# Patient Record
Sex: Female | Born: 1990 | Race: White | Hispanic: No | Marital: Single | Smoking: Former smoker
Health system: Southern US, Community
[De-identification: ages and names within clinical notes are randomized; demographics above are authoritative.]

## PROBLEM LIST (undated history)

## (undated) DIAGNOSIS — R569 Unspecified convulsions: Secondary | ICD-10-CM

## (undated) DIAGNOSIS — R51 Headache: Secondary | ICD-10-CM

## (undated) DIAGNOSIS — R519 Headache, unspecified: Secondary | ICD-10-CM

## (undated) DIAGNOSIS — F419 Anxiety disorder, unspecified: Secondary | ICD-10-CM

## (undated) HISTORY — PX: WISDOM TOOTH EXTRACTION: SHX21

---

## 2017-05-05 ENCOUNTER — Observation Stay (HOSPITAL_BASED_OUTPATIENT_CLINIC_OR_DEPARTMENT_OTHER)
Admission: EM | Admit: 2017-05-05 | Discharge: 2017-05-06 | Disposition: A | Discharge status: Home / Self Care | Payer: BLUE CROSS/BLUE SHIELD | Attending: Internal Medicine | Admitting: Internal Medicine

## 2017-05-05 ENCOUNTER — Encounter (HOSPITAL_BASED_OUTPATIENT_CLINIC_OR_DEPARTMENT_OTHER): Payer: Self-pay | Admitting: *Deleted

## 2017-05-05 ENCOUNTER — Emergency Department (HOSPITAL_BASED_OUTPATIENT_CLINIC_OR_DEPARTMENT_OTHER): Payer: BLUE CROSS/BLUE SHIELD

## 2017-05-05 DIAGNOSIS — G9389 Other specified disorders of brain: Secondary | ICD-10-CM

## 2017-05-05 DIAGNOSIS — R569 Unspecified convulsions: Secondary | ICD-10-CM | POA: Insufficient documentation

## 2017-05-05 DIAGNOSIS — G939 Disorder of brain, unspecified: Secondary | ICD-10-CM | POA: Diagnosis not present

## 2017-05-05 LAB — CBC WITH DIFFERENTIAL/PLATELET
Basophils Absolute: 0 10*3/uL (ref 0.0–0.1)
Basophils Relative: 1 %
Eosinophils Absolute: 0.1 10*3/uL (ref 0.0–0.7)
Eosinophils Relative: 1 %
HCT: 36.9 % (ref 36.0–46.0)
Hemoglobin: 12.5 g/dL (ref 12.0–15.0)
Lymphocytes Relative: 20 %
Lymphs Abs: 1 10*3/uL (ref 0.7–4.0)
MCH: 31 pg (ref 26.0–34.0)
MCHC: 33.9 g/dL (ref 30.0–36.0)
MCV: 91.6 fL (ref 78.0–100.0)
Monocytes Absolute: 0.4 10*3/uL (ref 0.1–1.0)
Monocytes Relative: 9 %
Neutro Abs: 3.3 10*3/uL (ref 1.7–7.7)
Neutrophils Relative %: 69 %
Platelets: 179 10*3/uL (ref 150–400)
RBC: 4.03 MIL/uL (ref 3.87–5.11)
RDW: 14.1 % (ref 11.5–15.5)
WBC: 4.8 10*3/uL (ref 4.0–10.5)

## 2017-05-05 LAB — CBG MONITORING, ED
Glucose-Capillary: 99 mg/dL (ref 65–99)
Glucose-Capillary: 90 mg/dL (ref 65–99)

## 2017-05-05 LAB — BASIC METABOLIC PANEL
Anion gap: 7 (ref 5–15)
BUN: 9 mg/dL (ref 6–20)
CO2: 25 mmol/L (ref 22–32)
Calcium: 8.5 mg/dL — ABNORMAL LOW (ref 8.9–10.3)
Chloride: 104 mmol/L (ref 101–111)
Creatinine, Ser: 0.65 mg/dL (ref 0.44–1.00)
GFR calc Af Amer: >60 mL/min (ref >60)
GFR calc non Af Amer: >60 mL/min (ref >60)
Glucose, Bld: 94 mg/dL (ref 65–99)
Potassium: 4.1 mmol/L (ref 3.5–5.1)
Sodium: 136 mmol/L (ref 135–145)

## 2017-05-05 LAB — RAPID URINE DRUG SCREEN, HOSP PERFORMED
Amphetamines: NOT DETECTED
Barbiturates: NOT DETECTED
Benzodiazepines: NOT DETECTED
Cocaine: NOT DETECTED
Opiates: NOT DETECTED
Tetrahydrocannabinol: POSITIVE — AB

## 2017-05-05 LAB — PREGNANCY, URINE: Preg Test, Ur: NEGATIVE

## 2017-05-05 LAB — ETHANOL: Alcohol, Ethyl (B): <5 mg/dL (ref <5)

## 2017-05-05 MED ORDER — SODIUM CHLORIDE 0.9 % IV SOLN
500.0000 mg | Freq: Once | INTRAVENOUS | Status: AC
  Administered 2017-05-05: 500 mg via INTRAVENOUS
  Filled 2017-05-05: qty 5

## 2017-05-05 MED ORDER — SODIUM CHLORIDE 0.9 % IV BOLUS (SEPSIS)
1000.0000 mL | Freq: Once | INTRAVENOUS | Status: AC
  Administered 2017-05-05: 1000 mL via INTRAVENOUS

## 2017-05-05 MED ORDER — LORAZEPAM 2 MG/ML IJ SOLN
INTRAMUSCULAR | Status: AC
  Filled 2017-05-05: qty 1

## 2017-05-05 MED ORDER — ACETAMINOPHEN 325 MG PO TABS: 650.0000 mg | ORAL_TABLET | Freq: Four times a day (QID) | ORAL | Status: DC | PRN

## 2017-05-05 MED ORDER — ONDANSETRON HCL 4 MG/2ML IJ SOLN
4.0000 mg | Freq: Once | INTRAMUSCULAR | Status: AC
  Administered 2017-05-05: 4 mg via INTRAVENOUS
  Filled 2017-05-05: qty 2

## 2017-05-05 MED ORDER — DEXAMETHASONE SODIUM PHOSPHATE 4 MG/ML IJ SOLN
6.0000 mg | Freq: Once | INTRAMUSCULAR | Status: AC
  Administered 2017-05-05: 6 mg via INTRAVENOUS
  Filled 2017-05-05: qty 2

## 2017-05-05 MED ORDER — DEXAMETHASONE SODIUM PHOSPHATE 4 MG/ML IJ SOLN
4.0000 mg | Freq: Four times a day (QID) | INTRAMUSCULAR | Status: DC
  Administered 2017-05-05 – 2017-05-06 (×3): 4 mg via INTRAVENOUS
  Filled 2017-05-05 (×3): qty 1

## 2017-05-05 MED ORDER — LORAZEPAM 2 MG/ML IJ SOLN
1.0000 mg | Freq: Once | INTRAMUSCULAR | Status: AC
  Administered 2017-05-05: 1 mg via INTRAVENOUS

## 2017-05-05 MED ORDER — ONDANSETRON HCL 4 MG/2ML IJ SOLN: 4.0000 mg | Freq: Four times a day (QID) | INTRAMUSCULAR | Status: DC | PRN

## 2017-05-05 MED ORDER — KETOROLAC TROMETHAMINE 30 MG/ML IJ SOLN
15.0000 mg | Freq: Once | INTRAMUSCULAR | Status: AC
  Administered 2017-05-05: 15 mg via INTRAVENOUS
  Filled 2017-05-05: qty 1

## 2017-05-05 MED ORDER — FENTANYL CITRATE (PF) 100 MCG/2ML IJ SOLN: 25.0000 ug | Freq: Once | INTRAMUSCULAR | Status: DC

## 2017-05-05 MED ORDER — LEVETIRACETAM 500 MG/5ML IV SOLN
INTRAVENOUS | Status: AC
  Administered 2017-05-05: 500 mg
  Filled 2017-05-05: qty 5

## 2017-05-05 MED ORDER — LEVETIRACETAM 500 MG PO TABS
500.0000 mg | ORAL_TABLET | Freq: Two times a day (BID) | ORAL | Status: DC
  Administered 2017-05-05 – 2017-05-06 (×2): 500 mg via ORAL
  Filled 2017-05-05 (×2): qty 1

## 2017-05-05 NOTE — ED Provider Notes (Signed)
[facility] Provider Note   CSN: [id number] Arrival date & time: 05/05/17  1214     History   Chief Complaint Chief Complaint  Patient presents with  . Seizures    HPI Shelly Quinn is a 26 y.o. female with no significant past medical history who presents today accompanied by her boyfriend with chief complaint acute onset, resolved seizure-like activity. Patient's boyfriend states that at around 9 AM this morning he and patient were both in bed sleeping. He notes that she turned over, and then "began shaking all over" for a period of approximately 1 minute. During this time, patient's boyfriend states that she was unresponsive with eyes "half open". He denies head injury or fall. He notes that after this period, patient appeared confused for several minutes. During this time, patient got up to "use the bathroom"and attempted to take a shower. She got out of the shower, and fell forward hitting her left hip. After that time, patient appeared to be more oriented and alert and she went to rest for approximately one hour. Had 1 episode of NBNB emesis. Currently she endorses 6/10 aching frontal headache and nausea. States this headache is no worse than other headaches she has had in the past. Denies vision changes, SOB, chest pain, abdominal pain. Denies pain of the hip. She has been ambulatory since the incident without difficulty. She denies numbness, tingling, or weakness. No prior history of seizures in the past. Endorses daily marijuana use, but denies any other recreational drug use. States will have 2-3 beers nightly.  The history is provided by the patient and a significant other.    History reviewed. No pertinent past medical history.  Patient Active Problem List   Diagnosis Date Noted  . Seizure ([place]) 05/05/2017    History reviewed. No pertinent surgical history.  OB History    No data available       Home Medications    Prior to Admission medications   Not  on File    Family History No family history on file.  Social History Social History  Substance Use Topics  . Smoking status: Never Smoker  . Smokeless tobacco: Never Used  . Alcohol use Yes     Allergies   Patient has no known allergies.   Review of Systems Review of Systems  Constitutional: Negative for chills and fever.  HENT: Negative for facial swelling.   Eyes: Negative for visual disturbance.  Respiratory: Negative for shortness of breath.   Cardiovascular: Negative for chest pain.  Gastrointestinal: Positive for nausea and vomiting. Negative for abdominal pain.  Musculoskeletal: Negative for arthralgias and gait problem.  Neurological: Positive for seizures and headaches. Negative for weakness and numbness.  Psychiatric/Behavioral: Positive for confusion.     Physical Exam Updated Vital Signs BP 107/73   Pulse 82   Temp 98.7 F (37.1 C) (Oral)   Resp 15   Ht 5\' 4"  (1.626 m)   Wt 49.9 kg (110 lb)   LMP 05/01/2017   SpO2 97%   BMI 18.88 kg/m   Physical Exam  Constitutional: She is oriented to person, place, and time. She appears well-developed and well-nourished. No distress.  HENT:  Head: Normocephalic and atraumatic.  Right Ear: External ear normal.  Left Ear: External ear normal.  Mouth/Throat: Oropharynx is clear and moist.  No battle signs, no raccoons eyes, no rhinorrhea. No tenderness to palpation of the face or jaw. No deformity or crepitus noted. Mild tenderness to palpation of the top of  the skull, no deformity, ecchymosis,swelling or crepitus noted.  Eyes: Pupils are equal, round, and reactive to light. Conjunctivae and EOM are normal. Right eye exhibits no discharge. Left eye exhibits no discharge.  Neck: Normal range of motion. Neck supple. No JVD present. No tracheal deviation present.  No midline spine TTP, no paraspinal muscle tenderness, no deformity, crepitus, or step-off noted.  Cardiovascular: Normal rate, regular rhythm, normal heart  sounds and intact distal pulses.   2+ radial and DP/PT pulses bl, negative Homan's bl   Pulmonary/Chest: Effort normal and breath sounds normal. She exhibits no tenderness.  Abdominal: Soft. Bowel sounds are normal. She exhibits no distension. There is no tenderness.  Musculoskeletal: Normal range of motion. She exhibits no edema or tenderness.  No tenderness to palpation, deformity, or crepitus on palpation of the extremities. No midline spine TTP, no paraspinal muscle tenderness, no deformity, crepitus, or step-off noted. Moves extremities spontaneously. 5/5 strength of BUE and BLE major muscle groups. Good grip strength.  Lymphadenopathy:    She has no cervical adenopathy.  Neurological: She is alert and oriented to person, place, and time. No cranial nerve deficit or sensory deficit.  Fluent speech, no facial droop, sensation intact to soft touch of extremities, normal gait, and patient able to heel walk and toe walk without difficulty. Cranial nerves III through XII tested and intact.   Skin: Skin is warm and dry. Capillary refill takes less than 2 seconds. No erythema.  2cm area of ecchymosis to the left lateral hip.  Psychiatric: She has a normal mood and affect. Her behavior is normal.  Nursing note and vitals reviewed.    ED Treatments / Results  Labs (all labs ordered are listed, but only abnormal results are displayed) Labs Reviewed  BASIC METABOLIC PANEL - Abnormal; Notable for the following:       Result Value   Calcium 8.5 (*)    All other components within normal limits  RAPID URINE DRUG SCREEN, HOSP PERFORMED - Abnormal; Notable for the following:    Tetrahydrocannabinol POSITIVE (*)    All other components within normal limits  CBC WITH DIFFERENTIAL/PLATELET  PREGNANCY, URINE  ETHANOL  CBG MONITORING, ED    EKG  EKG Interpretation None       Radiology Ct Head Wo Contrast  Result Date: 05/05/2017 CLINICAL DATA:  First time seizure EXAM: CT HEAD WITHOUT  CONTRAST TECHNIQUE: Contiguous axial images were obtained from the base of the skull through the vertex without intravenous contrast. COMPARISON:  None. FINDINGS: Brain: 25 mm patchy high-density area in the subcortical right frontal lobe with minimal surrounding low-density. Appearance has masslike features, with hemorrhage or mineralization. Mass effect is minimal for size. Cavernoma or oligodendroglioma are considered with this appearance. Bland hemorrhage with multiple small pockets is also possible. No evidence of neighboring high-density vascular structure. Elsewhere the brain has a normal appearance. No infarct, hydrocephalus, or atrophy. Vascular: No hyperdense vessel or unexpected calcification. Skull: Negative Sinuses/Orbits: Negative Other: These results were called by telephone at the time of interpretation on 05/05/2017 at 2:24 pm to Dr. Venita Sheffield, who verbally acknowledged these results. IMPRESSION: 25 mm masslike abnormality in the subcortical anterior right frontal lobe. The abnormality is high-density from acute hemorrhage or mineralization. Recommend brain MRI with contrast. Electronically Signed   By: Monte Fantasia M.D.   On: 05/05/2017 14:27    Procedures Procedures (including critical care time)  Medications Ordered in ED Medications  fentaNYL (SUBLIMAZE) injection 25 mcg (25 mcg Intravenous Not Given 05/05/17  1612)  ondansetron (ZOFRAN) injection 4 mg (4 mg Intravenous Given 05/05/17 1258)  ketorolac (TORADOL) 30 MG/ML injection 15 mg (15 mg Intravenous Given 05/05/17 1300)  sodium chloride 0.9 % bolus 1,000 mL (0 mLs Intravenous Stopped 05/05/17 1401)  LORazepam (ATIVAN) injection 1 mg (1 mg Intravenous Given 05/05/17 1406)  levETIRAcetam (KEPPRA) 500 mg in sodium chloride 0.9 % 100 mL IVPB (0 mg Intravenous Stopped 05/05/17 1542)  dexamethasone (DECADRON) injection 6 mg (6 mg Intravenous Given 05/05/17 1514)  levETIRAcetam (KEPPRA) 500 MG/5ML injection (500 mg  Given 05/05/17 1518)      Initial Impression / Assessment and Plan / ED Course  I have reviewed the triage vital signs and the nursing notes.  Pertinent labs & imaging results that were available during my care of the patient were reviewed by me and considered in my medical decision making (see chart for details).     Patient with seizure-like activity observed earlier today with no known seizure disorder. Afebrile, vital signs are stable, CBG is normal. No focal neurological deficits, complaining only of headache and nausea. CBC, CMP unremarkable. UDS consistent with marijuana use which patient admits to. Alcohol negative. Will obtain head CT in the presence of new onset seizure. 2:08 PM Patient experienced witnessed seizure lasting approximately 90 seconds in length with short 4-5 second pause after the first 60 seconds and recurrence in seizure activity for another 30 seconds. 1 mg IV Ativan administered. CT head shows 25 mm masslike abnormality in the subcortical anterior right frontal lobe.  Spoke with Dr. Rory Percy neuro hospitalist, who recommended admission to the medical service and administration of 500 mg Keppra and 6 mg of dexamethasone.  4:35 PM  Spoke with Dr. Aggie Moats hospitalist, who agrees to assume care of patient and will admit patient to a telemetry bed on the neurology floor at [facility]. Patient seen and evaluated by Dr. Bobby Rumpf who agrees with plan and assessment. He has spoken with neurosurgery, who is aware of patient and will consult.  Final Clinical Impressions(s) / ED Diagnoses   Final diagnoses:  Seizure [facility])  Brain lesion    New Prescriptions New Prescriptions   No medications on file     Debroah Baller 05/05/17 1639    Fredia Sorrow, MD 05/06/17 1113

## 2017-05-05 NOTE — ED Notes (Signed)
ED Provider at bedside. 

## 2017-05-05 NOTE — ED Provider Notes (Signed)
Medical screening examination/treatment/procedure(s) were performed by non-physician practitioner and as supervising physician I was immediately available for consultation/collaboration.   EKG Interpretation None        Results for orders placed or performed during the hospital encounter of 14/48/18  Basic metabolic panel  Result Value Ref Range   Sodium 136 135 - 145 mmol/L   Potassium 4.1 3.5 - 5.1 mmol/L   Chloride 104 101 - 111 mmol/L   CO2 25 22 - 32 mmol/L   Glucose, Bld 94 65 - 99 mg/dL   BUN 9 6 - 20 mg/dL   Creatinine, Ser 0.65 0.44 - 1.00 mg/dL   Calcium 8.5 (L) 8.9 - 10.3 mg/dL   GFR calc non Af Amer >60 >60 mL/min   GFR calc Af Amer >60 >60 mL/min   Anion gap 7 5 - 15  CBC WITH DIFFERENTIAL  Result Value Ref Range   WBC 4.8 4.0 - 10.5 K/uL   RBC 4.03 3.87 - 5.11 MIL/uL   Hemoglobin 12.5 12.0 - 15.0 g/dL   HCT 36.9 36.0 - 46.0 %   MCV 91.6 78.0 - 100.0 fL   MCH 31.0 26.0 - 34.0 pg   MCHC 33.9 30.0 - 36.0 g/dL   RDW 14.1 11.5 - 15.5 %   Platelets 179 150 - 400 K/uL   Neutrophils Relative % 69 %   Neutro Abs 3.3 1.7 - 7.7 K/uL   Lymphocytes Relative 20 %   Lymphs Abs 1.0 0.7 - 4.0 K/uL   Monocytes Relative 9 %   Monocytes Absolute 0.4 0.1 - 1.0 K/uL   Eosinophils Relative 1 %   Eosinophils Absolute 0.1 0.0 - 0.7 K/uL   Basophils Relative 1 %   Basophils Absolute 0.0 0.0 - 0.1 K/uL  Pregnancy, urine  Result Value Ref Range   Preg Test, Ur NEGATIVE NEGATIVE  Rapid urine drug screen (hospital performed)  Result Value Ref Range   Opiates NONE DETECTED NONE DETECTED   Cocaine NONE DETECTED NONE DETECTED   Benzodiazepines NONE DETECTED NONE DETECTED   Amphetamines NONE DETECTED NONE DETECTED   Tetrahydrocannabinol POSITIVE (A) NONE DETECTED   Barbiturates NONE DETECTED NONE DETECTED  Ethanol/ETOH  Result Value Ref Range   Alcohol, Ethyl (B) <5 <5 mg/dL  POC CBG, ED  Result Value Ref Range   Glucose-Capillary 99 65 - 99 mg/dL   Ct Head Wo  Contrast  Result Date: 05/05/2017 CLINICAL DATA:  First time seizure EXAM: CT HEAD WITHOUT CONTRAST TECHNIQUE: Contiguous axial images were obtained from the base of the skull through the vertex without intravenous contrast. COMPARISON:  None. FINDINGS: Brain: 25 mm patchy high-density area in the subcortical right frontal lobe with minimal surrounding low-density. Appearance has masslike features, with hemorrhage or mineralization. Mass effect is minimal for size. Cavernoma or oligodendroglioma are considered with this appearance. Bland hemorrhage with multiple small pockets is also possible. No evidence of neighboring high-density vascular structure. Elsewhere the brain has a normal appearance. No infarct, hydrocephalus, or atrophy. Vascular: No hyperdense vessel or unexpected calcification. Skull: Negative Sinuses/Orbits: Negative Other: These results were called by telephone at the time of interpretation on 05/05/2017 at 2:24 pm to Dr. Venita Sheffield, who verbally acknowledged these results. IMPRESSION: 25 mm masslike abnormality in the subcortical anterior right frontal lobe. The abnormality is high-density from acute hemorrhage or mineralization. Recommend brain MRI with contrast. Electronically Signed   By: Monte Fantasia M.D.   On: 05/05/2017 14:27    Patient presented with new onset seizure that occurred  this morning. Here in the emergency department patient had a recurrent seizure coming back from head CT. Head CT shows evidence of a 25 mm mass right frontal lobe. Suggestive of probably tumor. Could also be from acute hemorrhage. No significant mass effect. Patient will need MRI.  When patient had recurrent seizure here was generalized in nature she received 1 mg of Ativan. Patient is stabilized out.  Discussed with the [facility] hospitalists at [facility] also discussed with the the PA for the neurosurgeon at [facility]. Patient will be admitted by the internal medicine hospitalist team and will be consult on by  neurosurgery. Patient does need MRI to further evaluate this lesion.  Status post the second seizure, patient given the Ativan as mentioned she is now back to baseline. Patient informed of the findings. Patient understands why she requires admission. Post seizure no significant neuro deficit.  CRITICAL CARE Performed by: Fredia Sorrow Total critical care time: 30 minutes Critical care time was exclusive of separately billable procedures and treating other patients. Critical care was necessary to treat or prevent imminent or life-threatening deterioration. Critical care was time spent personally by me on the following activities: development of treatment plan with patient and/or surrogate as well as nursing, discussions with consultants, evaluation of patient's response to treatment, examination of patient, obtaining history from patient or surrogate, ordering and performing treatments and interventions, ordering and review of laboratory studies, ordering and review of radiographic studies, pulse oximetry and re-evaluation of patient's condition.  Recommended MR for her is with contrast obviously since it being concerning for tumor.    Fredia Sorrow, MD 05/05/17 1536

## 2017-05-05 NOTE — Progress Notes (Signed)
Pt admitted to the unit from [place] via [facility]. Pt A&O x4; MAE x 4; telemetry applied and verified with CCMD; NT called to second verify; VSS; pt oriented to the unit and room; fall/safety precaution education completed with pt. Pt voices understanding and denies any question. No orders and MD paged for orders; pt skin clean, dry and intact with no pressure ulcers or opened wounds except from small bruised noted to left hip which pt report as a result from an earlier fall at home. Boyfriend at bedside and call light within reach. Bed alarm on. Will continue to closely monitor pt. Delia Heady RN

## 2017-05-05 NOTE — ED Notes (Signed)
Called to pt's room -- pt convulsing in bed (has just returned from CT); pt turned onto L side and oxygen via  applied. MD and PA-C at bedside. Seizure lasted approx 1.5 min. Ativan given IV (see MAR).

## 2017-05-05 NOTE — H&P (Signed)
History and Physical    Marney Treloar KTG:[id number] DOB: April 14, 1991 DOA: 05/05/2017  PCP: Patient, No Pcp Per  Patient coming from: Home  I have personally briefly reviewed patient's old medical records in [place]  Chief Complaint: Seizures  HPI: Wendelyn Kiesling is a 26 y.o. female with no significant PMH.  Patient presents to ED at [facility] with c/o acute onset but now resolved seizure-like activity.  Symptoms occurred at 9am this morning while in bed, witnessed by boyfriend who says she had full body tremor, lasted 1 min.  Unresponsive during episode.  After patient appeared confused.  1 episode NBNB emesis, 6/10 frontal headache that has now resolved post toradol in ED.  No PMH of seizures in past.  Uses marijuana daily but no other drugs.   ED Course: Headache resolved as above.  Given 500mg  Keppra and 1mg  ativan after she experienced a seizure witnessed in ED.  CT head shows 57mm mass like density in subcortical anterior R frontal lobe.  Neuro hospitalist recommended 500mg  keppra and 6mg  dexamethasone.  Patient transferred for admission and further work up.   Review of Systems: As per HPI otherwise 10 point review of systems negative.   History reviewed. No pertinent past medical history.  History reviewed. No pertinent surgical history.   reports that she has never smoked. She has never used smokeless tobacco. She reports that she drinks alcohol. She reports that she does not use drugs.  No Known Allergies  No family history on file. No seizure history in family.  Prior to Admission medications   Not on File    Physical Exam: Vitals:   05/05/17 1804 05/05/17 1830 05/05/17 1832 05/05/17 1946  BP: 109/79 100/64  (!) 104/59  Pulse: 87 71 61 73  Resp: 18 (!) 26 15 18   Temp:    98.2 F (36.8 C)  TempSrc:    Oral  SpO2: 98% 97% 96% 98%  Weight:      Height:    5\' 4"  (1.626 m)    Constitutional: NAD, calm, comfortable Eyes: PERRL, lids and conjunctivae  normal ENMT: Mucous membranes are moist. Posterior pharynx clear of any exudate or lesions.Normal dentition.  Neck: normal, supple, no masses, no thyromegaly Respiratory: clear to auscultation bilaterally, no wheezing, no crackles. Normal respiratory effort. No accessory muscle use.  Cardiovascular: Regular rate and rhythm, no murmurs / rubs / gallops. No extremity edema. 2+ pedal pulses. No carotid bruits.  Abdomen: no tenderness, no masses palpated. No hepatosplenomegaly. Bowel sounds positive.  Musculoskeletal: no clubbing / cyanosis. No joint deformity upper and lower extremities. Good ROM, no contractures. Normal muscle tone.  Skin: no rashes, lesions, ulcers. No induration Neurologic: CN 2-12 grossly intact. Sensation intact, DTR normal. Strength 5/5 in all 4.  Psychiatric: Normal judgment and insight. Alert and oriented x 3. Normal mood.    Labs on Admission: I have personally reviewed following labs and imaging studies  CBC:  Recent Labs Lab 05/05/17 1248  WBC 4.8  NEUTROABS 3.3  HGB 12.5  HCT 36.9  MCV 91.6  PLT 428   Basic Metabolic Panel:  Recent Labs Lab 05/05/17 1248  NA 136  K 4.1  CL 104  CO2 25  GLUCOSE 94  BUN 9  CREATININE 0.65  CALCIUM 8.5*   GFR: Estimated Creatinine Clearance: 84.7 mL/min (by C-G formula based on SCr of 0.65 mg/dL). Liver Function Tests: No results for input(s): AST, ALT, ALKPHOS, BILITOT, PROT, ALBUMIN in the last 168 hours. No results for input(s):  LIPASE, AMYLASE in the last 168 hours. No results for input(s): AMMONIA in the last 168 hours. Coagulation Profile: No results for input(s): INR, PROTIME in the last 168 hours. Cardiac Enzymes: No results for input(s): CKTOTAL, CKMB, CKMBINDEX, TROPONINI in the last 168 hours. BNP (last 3 results) No results for input(s): PROBNP in the last 8760 hours. HbA1C: No results for input(s): HGBA1C in the last 72 hours. CBG:  Recent Labs Lab 05/05/17 1228 05/05/17 1652  GLUCAP 99  90   Lipid Profile: No results for input(s): CHOL, HDL, LDLCALC, TRIG, CHOLHDL, LDLDIRECT in the last 72 hours. Thyroid Function Tests: No results for input(s): TSH, T4TOTAL, FREET4, T3FREE, THYROIDAB in the last 72 hours. Anemia Panel: No results for input(s): VITAMINB12, FOLATE, FERRITIN, TIBC, IRON, RETICCTPCT in the last 72 hours. Urine analysis: No results found for: COLORURINE, APPEARANCEUR, LABSPEC, [place], GLUCOSEU, HGBUR, BILIRUBINUR, KETONESUR, PROTEINUR, UROBILINOGEN, NITRITE, LEUKOCYTESUR  Radiological Exams on Admission: Ct Head Wo Contrast  Result Date: 05/05/2017 CLINICAL DATA:  First time seizure EXAM: CT HEAD WITHOUT CONTRAST TECHNIQUE: Contiguous axial images were obtained from the base of the skull through the vertex without intravenous contrast. COMPARISON:  None. FINDINGS: Brain: 25 mm patchy high-density area in the subcortical right frontal lobe with minimal surrounding low-density. Appearance has masslike features, with hemorrhage or mineralization. Mass effect is minimal for size. Cavernoma or oligodendroglioma are considered with this appearance. Bland hemorrhage with multiple small pockets is also possible. No evidence of neighboring high-density vascular structure. Elsewhere the brain has a normal appearance. No infarct, hydrocephalus, or atrophy. Vascular: No hyperdense vessel or unexpected calcification. Skull: Negative Sinuses/Orbits: Negative Other: These results were called by telephone at the time of interpretation on 05/05/2017 at 2:24 pm to Dr. Venita Sheffield, who verbally acknowledged these results. IMPRESSION: 25 mm masslike abnormality in the subcortical anterior right frontal lobe. The abnormality is high-density from acute hemorrhage or mineralization. Recommend brain MRI with contrast. Electronically Signed   By: Monte Fantasia M.D.   On: 05/05/2017 14:27    EKG: Independently reviewed.  Assessment/Plan Principal Problem:   Frontal mass of brain Active  Problems:   Seizure ([place])    1. Frontal mass of brain - 1. MRI w/wo contrast ordered stat 2. NPO until results available 3. Plan to call [facility] for consult with results, (either emergently or in AM depending on what the results are).  NS was also called by EDP and said they would consult. 4. Continue decadron 4mg  Q6H IV for now 2. Seizure - 1. Continue Keppra 500 BID for now 2. Seizure precautions  DVT prophylaxis: SCDs until we know if hemorrhagic mass Code Status: Full Family Communication: No family in room Disposition Plan: Home after admit Consults called: Neurology called by EDP and recommended decadron Admission status: Place in obs   GARDNER, JARED M. DO [facility] Pager [phone]  If 7AM-7PM, please contact day team taking care of patient [email or web address] Password [facility]  05/05/2017, 9:03 PM

## 2017-05-05 NOTE — Progress Notes (Signed)
Received call in regards to patient Brought to [facility] for new onset seizures  CT head shows 47mm mass-like abnormality in subcortical anterior right frontal lobe Patient is going to be admitted under hospitalist service at [facility] for further evaluation - MRI Brain with contrast  - Seizure precautions. Seizure tx to Neurohospitalist  Will see patient after MRI brain has been completed likely tomorrow am.

## 2017-05-05 NOTE — ED Triage Notes (Signed)
New onset seizure. Her boyfriend states he woke to pt shaking. Describes a seizure that lasting about a minute. Once the seizure stopped he states once she was able to walk she was confused. Went to the bathroom to urinate but he found her in the shower and she fell hitting her right hip. She is alert oriented with c.o headache on arrival.

## 2017-05-05 NOTE — Plan of Care (Addendum)
A 27 year old female past medical history of substance abuse. Presents with new onset seizures. EDP consult neurology (Dr. Rory Percy) who would advises admission to the hospital under hospitalist with neurology consult. CT scan does not show acute infarct but does show a mass.. Patient loaded with Keppra and also givendecadron per neurology recommendations per EDP.  Status: Observation telemetry

## 2017-05-06 ENCOUNTER — Observation Stay (HOSPITAL_COMMUNITY): Payer: BLUE CROSS/BLUE SHIELD

## 2017-05-06 DIAGNOSIS — G939 Disorder of brain, unspecified: Secondary | ICD-10-CM | POA: Diagnosis not present

## 2017-05-06 LAB — HIV ANTIBODY (ROUTINE TESTING W REFLEX): HIV SCREEN 4TH GENERATION: NONREACTIVE

## 2017-05-06 MED ORDER — GADOBENATE DIMEGLUMINE 529 MG/ML IV SOLN
15.0000 mL | Freq: Once | INTRAVENOUS | Status: AC
  Administered 2017-05-06: 11 mL via INTRAVENOUS

## 2017-05-06 MED ORDER — LEVETIRACETAM 500 MG PO TABS: 500.0000 mg | ORAL_TABLET | Freq: Two times a day (BID) | ORAL | 1 refills | Status: DC

## 2017-05-06 NOTE — Progress Notes (Signed)
Patient ready for discharge to home; discharge instructions given and reviewed; Rx sent electronically; patient dressed and ready for discharge; will take out via wheelchair accompanied by her friend.

## 2017-05-06 NOTE — Consult Note (Signed)
CC:  Chief Complaint  Patient presents with  . Seizures    HPI: Shelly Quinn is a 26 y.o. female who presented to [facility] after having a seizure. Was sleeping when boyfriend noticed seizure like activity roughly 900am. Seizure lasted <3 minutes. Went to [facility] for evaluation where CT scan showed 49mm mass like abnormality. Did have a second seizure while returning from [place]. No seizures since. Sent to [facility] for admission and MRI. Feels well today although nervous about results. Denies any headaches, dizziness, paresthesias, changes in visions. Neg hx cancer. Not on anti-coag.   PMH: History reviewed. No pertinent past medical history.  PSH: History reviewed. No pertinent surgical history.  SH: Social History  Substance Use Topics  . Smoking status: Never Smoker  . Smokeless tobacco: Never Used  . Alcohol use Yes    MEDS: Prior to Admission medications   Not on File    ALLERGY: No Known Allergies  ROS: Review of Systems  Constitutional: Negative.   HENT: Negative.   Eyes: Negative.   Respiratory: Negative.   Cardiovascular: Negative.   Gastrointestinal: Negative.   Genitourinary: Negative.   Musculoskeletal: Negative.   Skin: Negative.   Neurological: Positive for seizures. Negative for dizziness, tingling, tremors, sensory change, speech change, focal weakness and headaches.  Endo/Heme/Allergies: Negative.     Vitals:   05/06/17 0612 05/06/17 0906  BP: (!) 88/54 (!) 98/50  Pulse: (!) 47 (!) 50  Resp: 18 16  Temp: 97.7 F (36.5 C) 98 F (36.7 C)   General appearance: WDWN, NAD, resting comfortably Eyes: PERRL, Fundoscopic: normal Cardiovascular: Regular rate and rhythm without murmurs, rubs, gallops. No edema or variciosities. Distal pulses normal. Pulmonary: Clear to auscultation Musculoskeletal:     Muscle tone upper extremities: Normal    Muscle tone lower extremities: Normal    Motor exam: Upper Extremities Deltoid Bicep Tricep  Grip  Right 5/5 5/5 5/5 5/5  Left 5/5 5/5 5/5 5/5   Lower Extremity IP Quad PF DF EHL  Right 5/5 5/5 5/5 5/5 5/5  Left 5/5 5/5 5/5 5/5 5/5   Neurological Awake, alert, oriented Memory and concentration grossly intact Speech fluent, appropriate CNII: Visual fields normal CNIII/IV/VI: EOMI CNV: Facial sensation normal CNVII: Symmetric, normal strength CNVIII: Grossly normal CNIX: Normal palate movement CNXI: Trap and SCM strength normal CN XII: Tongue protrusion normal Sensation grossly intact to LT DTR: Normal Coordination (finger/nose & heel/shin): Normal  IMAGING: CT HEAD IMPRESSION: 25 mm masslike abnormality in the subcortical anterior right frontal lobe. The abnormality is high-density from acute hemorrhage or mineralization. Recommend brain MRI with contrast.  MRI BRAIN IMPRESSION: 1. 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 Mild vasogenic edema without significant mass effect .  IMPRESSION/PLAN  26 y.o. 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 No seizures since 2nd one yesterday at [place] when coming back from [place]. Feels well this morning. Neuro intact. No urgent surgical intervention is necessary for this cavernoma as she is neurologically normal. We will manage this outpt - Rec continued monitoring at least until tomorrow  - Continue Keppra; consider consider to Neuro - Seizure precautions

## 2017-05-06 NOTE — Discharge Summary (Signed)
Physician Discharge Summary  Shelly Quinn YQM:[id number] DOB: 02/19/1991 DOA: 05/05/2017  PCP: Patient, No Pcp Per  Admit date: 05/05/2017 Discharge date: 05/06/2017  Admitted From: Home Disposition:  Home  Recommendations for Outpatient Follow-up:  1. Follow up with PCP in 1-2 weeks and with neurosurgery in one week 2. Please obtain BMP/CBC in one week 3. Please follow up on the following pending results:  Home Health: No Equipment/Devices: None  Discharge Condition: Stable CODE STATUS: Full Diet recommendation: Regular   Brief/Interim Summary: Shelly Quinn is a 26 y.o. female who presented to [facility] after having a seizure. Was sleeping when boyfriend noticed seizure like activity roughly 900am. Seizure lasted <3 minutes. Went to [facility] for evaluation where CT scan showed 63mm mass like abnormality. Did have a second seizure while returning from [place]. No seizures since. Sent to [facility] for [facility] and MRI. Feels well today although nervous about results. Denies any headaches, dizziness, paresthesias, changes in visions. Neg hx cancer. Not on anti-coag. Seen in consultation by neurosurgery who felt the patient has new-onset seizures related to right frontal likely cavernoma hemorrhage. She was neurologically at baseline neurosurgery feels patient can discharge home today as she has had no seizures. She is to continue on Keppra 500 mg twice a day. She'll follow-up in the office at the earliest convenience to discuss treatment for the Car[place] Noma on an elective basis.   Discharge Diagnoses:  Principal Problem:   Frontal mass of brain Active Problems:   Seizure [facility])    Discharge Instructions  Discharge Instructions    Diet - low sodium heart healthy    Complete by:  As directed    Discharge instructions    Complete by:  As directed    Follow up with Neurosurgery in 1 week   Increase activity slowly    Complete by:  As directed      Allergies as of  05/06/2017   No Known Allergies     Medication List    TAKE these medications   acetaminophen 500 MG tablet Commonly known as:  TYLENOL Take 500 mg by mouth every 6 (six) hours as needed for fever.   ibuprofen 200 MG tablet Commonly known as:  ADVIL,MOTRIN Take 200 mg by mouth every 6 (six) hours as needed for mild pain.   levETIRAcetam 500 MG tablet Commonly known as:  KEPPRA Take 1 tablet (500 mg total) by mouth 2 (two) times daily.      Follow-up Information    Consuella Lose, MD. Call.   Specialty:  Neurosurgery Contact information: [address] [place] [address] [place] [address] [phone]          No Known Allergies  Consultations:  Neurosurgery Dr. Cato Mulligan   Procedures/Studies: Ct Head Wo Contrast  Result Date: 05/05/2017 CLINICAL DATA:  First time seizure EXAM: CT HEAD WITHOUT CONTRAST TECHNIQUE: Contiguous axial images were obtained from the base of the skull through the vertex without intravenous contrast. COMPARISON:  None. FINDINGS: Brain: 25 mm patchy high-density area in the subcortical right frontal lobe with minimal surrounding low-density. Appearance has masslike features, with hemorrhage or mineralization. Mass effect is minimal for size. Cavernoma or oligodendroglioma are considered with this appearance. Bland hemorrhage with multiple small pockets is also possible. No evidence of neighboring high-density vascular structure. Elsewhere the brain has a normal appearance. No infarct, hydrocephalus, or atrophy. Vascular: No hyperdense vessel or unexpected calcification. Skull: Negative Sinuses/Orbits: Negative Other: These results were called by telephone at the time of  interpretation on 05/05/2017 at 2:24 pm to Dr. Venita Sheffield, who verbally acknowledged these results. IMPRESSION: 25 mm masslike abnormality in the subcortical anterior right frontal lobe. The abnormality is high-density from acute hemorrhage or mineralization. Recommend brain MRI with  contrast. Electronically Signed   By: Monte Fantasia M.D.   On: 05/05/2017 14:27   Mr Jeri Cos QI Contrast  Result Date: 05/06/2017 CLINICAL DATA:  Seizure episode, frontal headache.  Follow-up mass. EXAM: MRI HEAD WITHOUT AND WITH CONTRAST TECHNIQUE: Multiplanar, multiecho pulse sequences of the brain and surrounding structures were obtained without and with intravenous contrast. CONTRAST:  49mL MULTIHANCE GADOBENATE DIMEGLUMINE 529 MG/ML IV SOLN COMPARISON:  CT HEAD May 05, 2017 FINDINGS: Post gadolinium sequences are moderately to severely motion degraded. INTRACRANIAL CONTENTS: 2.8 x 2.7 cm RIGHT frontal cortical to subcortical intraparenchymal mass with heterogeneous central T1 shortening, extensive susceptibility artifact, peripheral T2 hypointensity and thin marginal FLAIR T2 hyperintense signal. Minimal superimposed enhancement centrally. No satellite areas of susceptibility artifact. No reduced diffusion to suggest acute ischemia or hypercellular tumor. Ventricles and sulci normal for patient's age. No midline shift or significant mass effect. No abnormal extra-axial fluid collections or abnormal enhancement. VASCULAR: Normal major intracranial vascular flow voids present at skull base. SKULL AND UPPER CERVICAL SPINE: No abnormal sellar expansion. No suspicious calvarial bone marrow signal. Craniocervical junction maintained. SINUSES/ORBITS: The mastoid air-cells and included paranasal sinuses are well-aerated.The included ocular globes and orbital contents are non-suspicious. OTHER: None. IMPRESSION: 1. 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 Mild vasogenic edema without significant mass effect . Electronically Signed   By: Elon Alas M.D.   On: 05/06/2017 01:30       Subjective:  Feels well wants to discharge home   Discharge Exam: Vitals:   05/06/17 0906 05/06/17 1335  BP: (!) 98/50 (!) 103/49  Pulse: (!) 50 (!) 107  Resp: 16 20  Temp: 98 F (36.7 C) 98.6 F (37 C)    Vitals:   05/06/17 0200 05/06/17 0612 05/06/17 0906 05/06/17 1335  BP: (!) 100/55 (!) 88/54 (!) 98/50 (!) 103/49  Pulse: (!) 49 (!) 47 (!) 50 (!) 107  Resp: 18 18 16 20   Temp: 98.1 F (36.7 C) 97.7 F (36.5 C) 98 F (36.7 C) 98.6 F (37 C)  TempSrc: Oral Oral Oral Oral  SpO2: 98% 99% 100% 100%  Weight:      Height:        General: Pt is alert, awake, not in acute distress Cardiovascular: RRR, S1/S2 +, no rubs, no gallops Respiratory: CTA bilaterally, no wheezing, no rhonchi Abdominal: Soft, NT, ND, bowel sounds + Extremities: no edema, no cyanosis    The results of significant diagnostics from this hospitalization (including imaging, microbiology, ancillary and laboratory) are listed below for reference.     Microbiology: No results found for this or any previous visit (from the past 240 hour(s)).   Labs: BNP (last 3 results) No results for input(s): BNP in the last 8760 hours. Basic Metabolic Panel:  Recent Labs Lab 05/05/17 1248  NA 136  K 4.1  CL 104  CO2 25  GLUCOSE 94  BUN 9  CREATININE 0.65  CALCIUM 8.5*   Liver Function Tests: No results for input(s): AST, ALT, ALKPHOS, BILITOT, PROT, ALBUMIN in the last 168 hours. No results for input(s): LIPASE, AMYLASE in the last 168 hours. No results for input(s): AMMONIA in the last 168 hours. CBC:  Recent Labs Lab 05/05/17 1248  WBC 4.8  NEUTROABS 3.3  HGB 12.5  HCT 36.9  MCV 91.6  PLT 179   Cardiac Enzymes: No results for input(s): CKTOTAL, CKMB, CKMBINDEX, TROPONINI in the last 168 hours. BNP: Invalid input(s): POCBNP CBG:  Recent Labs Lab 05/05/17 1228 05/05/17 1652  GLUCAP 99 90   D-Dimer No results for input(s): DDIMER in the last 72 hours. Hgb A1c No results for input(s): HGBA1C in the last 72 hours. Lipid Profile No results for input(s): CHOL, HDL, LDLCALC, TRIG, CHOLHDL, LDLDIRECT in the last 72 hours. Thyroid function studies No results for input(s): TSH, T4TOTAL, T3FREE,  THYROIDAB in the last 72 hours.  Invalid input(s): FREET3 Anemia work up No results for input(s): VITAMINB12, FOLATE, FERRITIN, TIBC, IRON, RETICCTPCT in the last 72 hours. Urinalysis No results found for: COLORURINE, APPEARANCEUR, LABSPEC, [place], GLUCOSEU, HGBUR, BILIRUBINUR, KETONESUR, PROTEINUR, UROBILINOGEN, NITRITE, LEUKOCYTESUR Sepsis Labs Invalid input(s): PROCALCITONIN,  WBC,  LACTICIDVEN Microbiology No results found for this or any previous visit (from the past 240 hour(s)).   Time coordinating discharge: Over 30 minutes  SIGNED:   Lady Deutscher, MD, FACP  [facility] 05/06/2017, 2:28 PM Pager   If 7PM-7AM, please contact night-coverage [email or web address] Password [email or web address]

## 2017-05-23 ENCOUNTER — Other Ambulatory Visit: Payer: Self-pay | Admitting: Neurosurgery

## 2017-06-01 ENCOUNTER — Other Ambulatory Visit (HOSPITAL_COMMUNITY): Payer: Self-pay | Admitting: Neurosurgery

## 2017-06-01 DIAGNOSIS — Q283 Other malformations of cerebral vessels: Secondary | ICD-10-CM

## 2017-06-13 ENCOUNTER — Ambulatory Visit (HOSPITAL_COMMUNITY)
Admission: RE | Admit: 2017-06-13 | Discharge: 2017-06-13 | Disposition: A | Discharge status: Home / Self Care | Payer: BLUE CROSS/BLUE SHIELD | Source: Ambulatory Visit | Attending: Neurosurgery | Admitting: Neurosurgery

## 2017-06-13 DIAGNOSIS — Q283 Other malformations of cerebral vessels: Secondary | ICD-10-CM | POA: Diagnosis present

## 2017-06-14 ENCOUNTER — Encounter (HOSPITAL_COMMUNITY)
Admission: RE | Admit: 2017-06-14 | Discharge: 2017-06-14 | Disposition: A | Discharge status: Home / Self Care | Payer: BLUE CROSS/BLUE SHIELD | Source: Ambulatory Visit | Attending: Neurosurgery | Admitting: Neurosurgery

## 2017-06-14 ENCOUNTER — Other Ambulatory Visit (HOSPITAL_COMMUNITY): Payer: Self-pay | Admitting: *Deleted

## 2017-06-14 ENCOUNTER — Encounter (HOSPITAL_COMMUNITY): Payer: Self-pay

## 2017-06-14 DIAGNOSIS — Z01812 Encounter for preprocedural laboratory examination: Secondary | ICD-10-CM | POA: Diagnosis present

## 2017-06-14 DIAGNOSIS — Q283 Other malformations of cerebral vessels: Secondary | ICD-10-CM | POA: Insufficient documentation

## 2017-06-14 HISTORY — DX: Unspecified convulsions: R56.9

## 2017-06-14 HISTORY — DX: Anxiety disorder, unspecified: F41.9

## 2017-06-14 HISTORY — DX: Headache, unspecified: R51.9

## 2017-06-14 HISTORY — DX: Headache: R51

## 2017-06-14 LAB — BASIC METABOLIC PANEL
Anion gap: 6 (ref 5–15)
BUN: 11 mg/dL (ref 6–20)
CALCIUM: 9.4 mg/dL (ref 8.9–10.3)
CHLORIDE: 106 mmol/L (ref 101–111)
CO2: 28 mmol/L (ref 22–32)
CREATININE: 0.78 mg/dL (ref 0.44–1.00)
GFR calc non Af Amer: >60 mL/min (ref >60)
GLUCOSE: 71 mg/dL (ref 65–99)
Potassium: 4.1 mmol/L (ref 3.5–5.1)
Sodium: 140 mmol/L (ref 135–145)

## 2017-06-14 LAB — CBC
HEMATOCRIT: 40.2 % (ref 36.0–46.0)
HEMOGLOBIN: 13.6 g/dL (ref 12.0–15.0)
MCH: 30.5 pg (ref 26.0–34.0)
MCHC: 33.8 g/dL (ref 30.0–36.0)
MCV: 90.1 fL (ref 78.0–100.0)
PLATELETS: 195 10*3/uL (ref 150–400)
RBC: 4.46 MIL/uL (ref 3.87–5.11)
RDW: 13.7 % (ref 11.5–15.5)
WBC: 5.2 10*3/uL (ref 4.0–10.5)

## 2017-06-14 LAB — HCG, SERUM, QUALITATIVE: PREG SERUM: NEGATIVE

## 2017-06-14 LAB — ABO/RH: ABO/RH(D): A POS

## 2017-06-14 NOTE — Pre-Procedure Instructions (Signed)
Shelly Quinn  06/14/2017      [id number] [place], [place] [address] [place] [address] Phone: [phone] Fax: [phone]    Your procedure is scheduled on August 28,2018  Tuesday .  Report to [facility] Admitting at 5:30 A.M.  Call this number if you have problems the morning of surgery:  [phone]   Remember:  Do not eat food or drink liquids after midnight.   Take these medicines the morning of surgery with A SIP OF WATER Keppra  STOP ASPIRIN,ANTIINFLAMATORIES (IBUPROFEN,ALEVE,MOTRIN,ADVIL,GOODY'S POWDERS),HERBAL SUPPLEMENTS,FISH OIL,AND VITAMINS 5-7 DAYS PRIOR TO SURGERY   Do not wear jewelry, make-up or nail polish.  Do not wear lotions, powders, or perfumes, or deoderant.  Do not shave 48 hours prior to surgery.  Men may shave face and neck.  Do not bring valuables to the hospital.   [facility] is not responsible for any belongings or valuables.  Contacts, dentures or bridgework may not be worn into surgery.  Leave your suitcase in the car.  After surgery it may be brought to your room.  For patients admitted to the hospital, discharge time will be determined by your treatment team.  Patients discharged the day of surgery will not be allowed to drive home.    Special Instructions: [place] - Preparing for Surgery  Before surgery, you can play an important role.  Because skin is not sterile, your skin needs to be as free of germs as possible.  You can reduce the number of germs on you skin by washing with CHG (chlorahexidine gluconate) soap before surgery.  CHG is an antiseptic cleaner which kills germs and bonds with the skin to continue killing germs even after washing.  Please DO NOT use if you have an allergy to CHG or antibacterial soaps.  If your skin becomes reddened/irritated stop using the CHG and inform your nurse when you arrive at Short Stay.  Do not shave (including legs and underarms)  for at least 48 hours prior to the first CHG shower.  You may shave your face.  Please follow these instructions carefully:   1.  Shower with CHG Soap the night before surgery and the   morning of Surgery.  2.  If you choose to wash your hair, wash your hair first as usual with your normal shampoo.  3.  After you shampoo, rinse your hair and body thoroughly to remove the  Shampoo.  4.  Use CHG as you would any other liquid soap.  You can apply chg directly  to the skin and wash gently with scrungie or a clean washcloth.  5.  Apply the CHG Soap to your body ONLY FROM THE NECK DOWN.   Do not use on open wounds or open sores.  Avoid contact with your eyes,  ears, mouth and genitals (private parts).  Wash genitals (private parts) with your normal soap.  6.  Wash thoroughly, paying special attention to the area where your surgery will be performed.  7.  Thoroughly rinse your body with warm water from the neck down.  8.  DO NOT shower/wash with your normal soap after using and rinsing o  the CHG Soap.  9.  Pat yourself dry with a clean towel.            10.  Wear clean pajamas.            11.  Place clean sheets on your bed  the night of your first shower and do not sleep with pets.  Day of Surgery  Do not apply any lotions/deodorants the morning of surgery.  Please wear clean clothes to the hospital/surgery center.   Please read over the following fact sheets that you were given. Surgical Site Infection Prevention

## 2017-06-14 NOTE — Progress Notes (Signed)
No history of cardiac problems. Never seen cardiologist

## 2017-06-19 NOTE — Anesthesia Preprocedure Evaluation (Addendum)
Anesthesia Evaluation  Patient identified by MRN, date of birth, ID band Patient awake    Reviewed: Allergy & Precautions, NPO status , Patient's Chart, lab work & pertinent test results  Airway Mallampati: II  TM Distance: >3 FB Neck ROM: Full    Dental  (+) Dental Advisory Given   Pulmonary Current Smoker,    breath sounds clear to auscultation       Cardiovascular negative cardio ROS   Rhythm:Regular Rate:Normal     Neuro/Psych  Headaches, Seizures -,  Right frontal Cavernous malformaition    GI/Hepatic negative GI ROS, Neg liver ROS,   Endo/Other  negative endocrine ROS  Renal/GU negative Renal ROS     Musculoskeletal   Abdominal   Peds  Hematology negative hematology ROS (+)   Anesthesia Other Findings   Reproductive/Obstetrics                            Lab Results  Component Value Date   WBC 5.2 06/14/2017   HGB 13.6 06/14/2017   HCT 40.2 06/14/2017   MCV 90.1 06/14/2017   PLT 195 06/14/2017   Lab Results  Component Value Date   CREATININE 0.78 06/14/2017   BUN 11 06/14/2017   NA 140 06/14/2017   K 4.1 06/14/2017   CL 106 06/14/2017   CO2 28 06/14/2017    Anesthesia Physical Anesthesia Plan  ASA: III  Anesthesia Plan: General   Post-op Pain Management:    Induction: Intravenous  PONV Risk Score and Plan: 2 and Ondansetron, Dexamethasone and Treatment may vary due to age or medical condition  Airway Management Planned: Oral ETT  Additional Equipment: Arterial line  Intra-op Plan:   Post-operative Plan: Extubation in OR and Possible Post-op intubation/ventilation  Informed Consent: I have reviewed the patients History and Physical, chart, labs and discussed the procedure including the risks, benefits and alternatives for the proposed anesthesia with the patient or authorized representative who has indicated his/her understanding and acceptance.   Dental  advisory given  Plan Discussed with: CRNA  Anesthesia Plan Comments:        Anesthesia Quick Evaluation

## 2017-06-20 ENCOUNTER — Encounter (HOSPITAL_COMMUNITY): Payer: Self-pay

## 2017-06-20 ENCOUNTER — Inpatient Hospital Stay (HOSPITAL_COMMUNITY)
Admission: RE | Admit: 2017-06-20 | Discharge: 2017-06-22 | DRG: 026 | Disposition: A | Discharge status: Home / Self Care | Payer: BLUE CROSS/BLUE SHIELD | Source: Ambulatory Visit | Attending: Neurosurgery | Admitting: Neurosurgery

## 2017-06-20 ENCOUNTER — Encounter (HOSPITAL_COMMUNITY)
Admission: RE | Disposition: A | Discharge status: Home / Self Care | Payer: Self-pay | Source: Ambulatory Visit | Attending: Neurosurgery

## 2017-06-20 ENCOUNTER — Inpatient Hospital Stay (HOSPITAL_COMMUNITY): Payer: BLUE CROSS/BLUE SHIELD | Admitting: Certified Registered"

## 2017-06-20 DIAGNOSIS — I6200 Nontraumatic subdural hemorrhage, unspecified: Secondary | ICD-10-CM | POA: Diagnosis present

## 2017-06-20 DIAGNOSIS — R51 Headache: Secondary | ICD-10-CM | POA: Diagnosis present

## 2017-06-20 DIAGNOSIS — R569 Unspecified convulsions: Secondary | ICD-10-CM | POA: Diagnosis present

## 2017-06-20 DIAGNOSIS — D1800 Hemangioma unspecified site: Secondary | ICD-10-CM

## 2017-06-20 DIAGNOSIS — Z79899 Other long term (current) drug therapy: Secondary | ICD-10-CM

## 2017-06-20 DIAGNOSIS — Q048 Other specified congenital malformations of brain: Secondary | ICD-10-CM | POA: Diagnosis not present

## 2017-06-20 DIAGNOSIS — F17210 Nicotine dependence, cigarettes, uncomplicated: Secondary | ICD-10-CM | POA: Diagnosis present

## 2017-06-20 HISTORY — PX: CRANIOTOMY: SHX93

## 2017-06-20 HISTORY — PX: APPLICATION OF CRANIAL NAVIGATION: SHX6578

## 2017-06-20 LAB — MRSA PCR SCREENING: MRSA by PCR: NEGATIVE

## 2017-06-20 LAB — PREPARE RBC (CROSSMATCH)

## 2017-06-20 SURGERY — CRANIOTOMY TUMOR EXCISION
Anesthesia: General | Site: Head | Laterality: Right

## 2017-06-20 MED ORDER — DOCUSATE SODIUM 100 MG PO CAPS
100.0000 mg | ORAL_CAPSULE | Freq: Two times a day (BID) | ORAL | Status: DC
  Administered 2017-06-20: 100 mg via ORAL
  Filled 2017-06-20: qty 1

## 2017-06-20 MED ORDER — LIDOCAINE-EPINEPHRINE 1 %-1:100000 IJ SOLN
INTRAMUSCULAR | Status: DC | PRN
  Administered 2017-06-20: 7 mL

## 2017-06-20 MED ORDER — THROMBIN 5000 UNITS EX SOLR
CUTANEOUS | Status: AC
  Filled 2017-06-20: qty 5000

## 2017-06-20 MED ORDER — SODIUM CHLORIDE 0.9 % IV SOLN: INTRAVENOUS | Status: DC

## 2017-06-20 MED ORDER — SENNOSIDES-DOCUSATE SODIUM 8.6-50 MG PO TABS: 1.0000 | ORAL_TABLET | Freq: Every evening | ORAL | Status: DC | PRN

## 2017-06-20 MED ORDER — SODIUM CHLORIDE 0.9 % IV SOLN: 10.0000 mL/h | Freq: Once | INTRAVENOUS | Status: AC

## 2017-06-20 MED ORDER — NALOXONE HCL 0.4 MG/ML IJ SOLN: 0.0800 mg | INTRAMUSCULAR | Status: DC | PRN

## 2017-06-20 MED ORDER — LABETALOL HCL 5 MG/ML IV SOLN: 10.0000 mg | INTRAVENOUS | Status: DC | PRN

## 2017-06-20 MED ORDER — PROPOFOL 10 MG/ML IV BOLUS
INTRAVENOUS | Status: AC
Start: 2017-06-20 — End: 2017-06-20
  Filled 2017-06-20: qty 20

## 2017-06-20 MED ORDER — BACITRACIN ZINC 500 UNIT/GM EX OINT
TOPICAL_OINTMENT | CUTANEOUS | Status: DC | PRN
  Administered 2017-06-20: 1 via TOPICAL

## 2017-06-20 MED ORDER — MIDAZOLAM HCL 2 MG/2ML IJ SOLN
INTRAMUSCULAR | Status: AC
Start: 2017-06-20 — End: 2017-06-20
  Filled 2017-06-20: qty 2

## 2017-06-20 MED ORDER — PHENYLEPHRINE HCL 10 MG/ML IJ SOLN
INTRAMUSCULAR | Status: DC | PRN
  Administered 2017-06-20: 15 ug/min via INTRAVENOUS

## 2017-06-20 MED ORDER — LEVETIRACETAM 500 MG/5ML IV SOLN
500.0000 mg | INTRAVENOUS | Status: AC
  Administered 2017-06-20: 500 mg via INTRAVENOUS
  Filled 2017-06-20: qty 5

## 2017-06-20 MED ORDER — CHLORHEXIDINE GLUCONATE CLOTH 2 % EX PADS: 6.0000 | MEDICATED_PAD | Freq: Once | CUTANEOUS | Status: DC

## 2017-06-20 MED ORDER — ACETAMINOPHEN 325 MG PO TABS: 650.0000 mg | ORAL_TABLET | ORAL | Status: DC | PRN

## 2017-06-20 MED ORDER — HYDROCODONE-ACETAMINOPHEN 5-325 MG PO TABS
1.0000 | ORAL_TABLET | ORAL | Status: DC | PRN
  Administered 2017-06-20 – 2017-06-22 (×7): 1 via ORAL
  Filled 2017-06-20 (×7): qty 1

## 2017-06-20 MED ORDER — LIDOCAINE-EPINEPHRINE 1 %-1:100000 IJ SOLN
INTRAMUSCULAR | Status: AC
  Filled 2017-06-20: qty 1

## 2017-06-20 MED ORDER — THROMBIN 20000 UNITS EX SOLR
CUTANEOUS | Status: AC
  Filled 2017-06-20: qty 20000

## 2017-06-20 MED ORDER — HEMOSTATIC AGENTS (NO CHARGE) OPTIME
TOPICAL | Status: DC | PRN
  Administered 2017-06-20: 1 via TOPICAL

## 2017-06-20 MED ORDER — DEXAMETHASONE SODIUM PHOSPHATE 4 MG/ML IJ SOLN
4.0000 mg | Freq: Four times a day (QID) | INTRAMUSCULAR | Status: AC
  Administered 2017-06-21 – 2017-06-22 (×4): 4 mg via INTRAVENOUS
  Filled 2017-06-20 (×4): qty 1

## 2017-06-20 MED ORDER — ARTIFICIAL TEARS OPHTHALMIC OINT
TOPICAL_OINTMENT | OPHTHALMIC | Status: AC
Start: 2017-06-20 — End: 2017-06-20
  Filled 2017-06-20: qty 3.5

## 2017-06-20 MED ORDER — THROMBIN 5000 UNITS EX SOLR
CUTANEOUS | Status: DC | PRN
  Administered 2017-06-20: 5 mL via TOPICAL

## 2017-06-20 MED ORDER — BUPIVACAINE HCL (PF) 0.5 % IJ SOLN
INTRAMUSCULAR | Status: DC | PRN
  Administered 2017-06-20: 7 mL

## 2017-06-20 MED ORDER — HYDROMORPHONE HCL 1 MG/ML IJ SOLN
0.2500 mg | INTRAMUSCULAR | Status: DC | PRN
  Administered 2017-06-20 (×2): 0.5 mg via INTRAVENOUS

## 2017-06-20 MED ORDER — ACETAMINOPHEN 650 MG RE SUPP: 650.0000 mg | RECTAL | Status: DC | PRN

## 2017-06-20 MED ORDER — NEOSTIGMINE METHYLSULFATE 10 MG/10ML IV SOLN
INTRAVENOUS | Status: DC | PRN
  Administered 2017-06-20: 2 mg via INTRAVENOUS

## 2017-06-20 MED ORDER — ONDANSETRON HCL 4 MG/2ML IJ SOLN
4.0000 mg | INTRAMUSCULAR | Status: DC | PRN
  Administered 2017-06-20: 4 mg via INTRAVENOUS
  Filled 2017-06-20: qty 2

## 2017-06-20 MED ORDER — FENTANYL CITRATE (PF) 250 MCG/5ML IJ SOLN
INTRAMUSCULAR | Status: AC
  Filled 2017-06-20: qty 5

## 2017-06-20 MED ORDER — LIDOCAINE HCL (CARDIAC) 20 MG/ML IV SOLN
INTRAVENOUS | Status: DC | PRN
  Administered 2017-06-20: 60 mg via INTRAVENOUS

## 2017-06-20 MED ORDER — BACITRACIN 50000 UNITS IM SOLR
INTRAMUSCULAR | Status: DC | PRN
  Administered 2017-06-20: 500 mL

## 2017-06-20 MED ORDER — LACTATED RINGERS IV SOLN
INTRAVENOUS | Status: DC | PRN
  Administered 2017-06-20: 08:00:00 via INTRAVENOUS

## 2017-06-20 MED ORDER — DEXAMETHASONE SODIUM PHOSPHATE 4 MG/ML IJ SOLN: 4.0000 mg | Freq: Three times a day (TID) | INTRAMUSCULAR | Status: DC

## 2017-06-20 MED ORDER — ONDANSETRON HCL 4 MG/2ML IJ SOLN
INTRAMUSCULAR | Status: AC
Start: 2017-06-20 — End: 2017-06-20
  Filled 2017-06-20: qty 2

## 2017-06-20 MED ORDER — PANTOPRAZOLE SODIUM 40 MG IV SOLR
40.0000 mg | Freq: Every day | INTRAVENOUS | Status: DC
  Administered 2017-06-20: 40 mg via INTRAVENOUS
  Filled 2017-06-20: qty 40

## 2017-06-20 MED ORDER — BACITRACIN ZINC 500 UNIT/GM EX OINT
TOPICAL_OINTMENT | CUTANEOUS | Status: AC
  Filled 2017-06-20: qty 28.35

## 2017-06-20 MED ORDER — ONDANSETRON HCL 4 MG/2ML IJ SOLN
INTRAMUSCULAR | Status: DC | PRN
  Administered 2017-06-20: 4 mg via INTRAVENOUS

## 2017-06-20 MED ORDER — ROCURONIUM BROMIDE 100 MG/10ML IV SOLN
INTRAVENOUS | Status: DC | PRN
  Administered 2017-06-20: 20 mg via INTRAVENOUS
  Administered 2017-06-20: 30 mg via INTRAVENOUS

## 2017-06-20 MED ORDER — DEXAMETHASONE SODIUM PHOSPHATE 10 MG/ML IJ SOLN
INTRAMUSCULAR | Status: DC | PRN
  Administered 2017-06-20: 10 mg via INTRAVENOUS

## 2017-06-20 MED ORDER — THROMBIN 20000 UNITS EX SOLR
CUTANEOUS | Status: DC | PRN
  Administered 2017-06-20: 20 mL via TOPICAL

## 2017-06-20 MED ORDER — DEXAMETHASONE SODIUM PHOSPHATE 10 MG/ML IJ SOLN
6.0000 mg | Freq: Four times a day (QID) | INTRAMUSCULAR | Status: AC
Start: 2017-06-20 — End: 2017-06-21
  Administered 2017-06-20 – 2017-06-21 (×4): 6 mg via INTRAVENOUS
  Filled 2017-06-20 (×4): qty 1

## 2017-06-20 MED ORDER — PROMETHAZINE HCL 12.5 MG PO TABS
12.5000 mg | ORAL_TABLET | ORAL | Status: DC | PRN
  Filled 2017-06-20: qty 2

## 2017-06-20 MED ORDER — FENTANYL CITRATE (PF) 100 MCG/2ML IJ SOLN
INTRAMUSCULAR | Status: DC | PRN
  Administered 2017-06-20 (×2): 50 ug via INTRAVENOUS

## 2017-06-20 MED ORDER — 0.9 % SODIUM CHLORIDE (POUR BTL) OPTIME
TOPICAL | Status: DC | PRN
  Administered 2017-06-20 (×3): 1000 mL

## 2017-06-20 MED ORDER — CEFAZOLIN SODIUM-DEXTROSE 1-4 GM/50ML-% IV SOLN
1.0000 g | Freq: Three times a day (TID) | INTRAVENOUS | Status: AC
  Administered 2017-06-20 – 2017-06-21 (×2): 1 g via INTRAVENOUS
  Filled 2017-06-20 (×2): qty 50

## 2017-06-20 MED ORDER — PROPOFOL 10 MG/ML IV BOLUS
INTRAVENOUS | Status: DC | PRN
  Administered 2017-06-20: 100 mg via INTRAVENOUS
  Administered 2017-06-20: 50 mg via INTRAVENOUS
  Administered 2017-06-20: 30 mg via INTRAVENOUS
  Administered 2017-06-20: 20 mg via INTRAVENOUS

## 2017-06-20 MED ORDER — SODIUM CHLORIDE 0.9 % IJ SOLN
INTRAMUSCULAR | Status: AC
  Filled 2017-06-20: qty 10

## 2017-06-20 MED ORDER — HYDROMORPHONE HCL 1 MG/ML IJ SOLN
0.5000 mg | INTRAMUSCULAR | Status: DC | PRN
  Administered 2017-06-20: 1 mg via INTRAVENOUS
  Administered 2017-06-20: 0.5 mg via INTRAVENOUS
  Administered 2017-06-20 – 2017-06-21 (×2): 1 mg via INTRAVENOUS
  Filled 2017-06-20: qty 1
  Filled 2017-06-20: qty 0.5
  Filled 2017-06-20 (×2): qty 1

## 2017-06-20 MED ORDER — BUPIVACAINE HCL (PF) 0.5 % IJ SOLN
INTRAMUSCULAR | Status: AC
  Filled 2017-06-20: qty 30

## 2017-06-20 MED ORDER — MAGNESIUM CITRATE PO SOLN: 1.0000 | Freq: Once | ORAL | Status: DC | PRN

## 2017-06-20 MED ORDER — MIDAZOLAM HCL 5 MG/5ML IJ SOLN
INTRAMUSCULAR | Status: DC | PRN
  Administered 2017-06-20: 1 mg via INTRAVENOUS

## 2017-06-20 MED ORDER — GLYCOPYRROLATE 0.2 MG/ML IJ SOLN
INTRAMUSCULAR | Status: DC | PRN
  Administered 2017-06-20: 0.2 mg via INTRAVENOUS

## 2017-06-20 MED ORDER — HYDROMORPHONE HCL 1 MG/ML IJ SOLN
INTRAMUSCULAR | Status: AC
  Administered 2017-06-20: 0.5 mg via INTRAVENOUS
  Filled 2017-06-20: qty 1

## 2017-06-20 MED ORDER — CEFAZOLIN SODIUM-DEXTROSE 2-4 GM/100ML-% IV SOLN
2.0000 g | INTRAVENOUS | Status: AC
  Administered 2017-06-20: 2 g via INTRAVENOUS
  Filled 2017-06-20: qty 100

## 2017-06-20 MED ORDER — SODIUM CHLORIDE 0.9 % IV SOLN
0.0500 ug/kg/min | INTRAVENOUS | Status: AC
  Administered 2017-06-20: 0.05 ug/kg/min via INTRAVENOUS
  Filled 2017-06-20: qty 5000

## 2017-06-20 MED ORDER — ONDANSETRON HCL 4 MG PO TABS: 4.0000 mg | ORAL_TABLET | ORAL | Status: DC | PRN

## 2017-06-20 MED ORDER — PHENYLEPHRINE 40 MCG/ML (10ML) SYRINGE FOR IV PUSH (FOR BLOOD PRESSURE SUPPORT)
PREFILLED_SYRINGE | INTRAVENOUS | Status: AC
  Filled 2017-06-20: qty 10

## 2017-06-20 MED ORDER — BISACODYL 5 MG PO TBEC: 5.0000 mg | DELAYED_RELEASE_TABLET | Freq: Every day | ORAL | Status: DC | PRN

## 2017-06-20 MED ORDER — SODIUM CHLORIDE 0.9 % IV SOLN
500.0000 mg | Freq: Two times a day (BID) | INTRAVENOUS | Status: DC
  Administered 2017-06-20 – 2017-06-22 (×4): 500 mg via INTRAVENOUS
  Filled 2017-06-20 (×4): qty 5

## 2017-06-20 SURGICAL SUPPLY — 107 items
BANDAGE GAUZE 4  KLING STR (GAUZE/BANDAGES/DRESSINGS) IMPLANT
BATTERY IQ STERILE (MISCELLANEOUS) ×4 IMPLANT
BENZOIN TINCTURE PRP APPL 2/3 (GAUZE/BANDAGES/DRESSINGS) IMPLANT
BLADE CLIPPER SURG (BLADE) ×4 IMPLANT
BLADE SAW GIGLI 16 STRL (MISCELLANEOUS) IMPLANT
BLADE SURG 15 STRL LF DISP TIS (BLADE) IMPLANT
BLADE SURG 15 STRL SS (BLADE)
BLADE ULTRA TIP 2M (BLADE) ×4 IMPLANT
BNDG GAUZE ELAST 4 BULKY (GAUZE/BANDAGES/DRESSINGS) ×8 IMPLANT
BNDG STRETCH 4X75 NS LF (GAUZE/BANDAGES/DRESSINGS) ×4 IMPLANT
BUR ACORN 6.0 PRECISION (BURR) ×3 IMPLANT
BUR ACORN 6.0MM PRECISION (BURR) ×1
BUR ROUND FLUTED 4 SOFT TCH (BURR) IMPLANT
BUR ROUND FLUTED 4MM SOFT TCH (BURR)
BUR SPIRAL ROUTER 2.3 (BUR) ×3 IMPLANT
BUR SPIRAL ROUTER 2.3MM (BUR) ×1
CANISTER SUCT 3000ML PPV (MISCELLANEOUS) ×8 IMPLANT
CARTRIDGE OIL MAESTRO DRILL (MISCELLANEOUS) ×2 IMPLANT
CATH VENTRIC 35X38 W/TROCAR LG (CATHETERS) IMPLANT
CLIP VESOCCLUDE MED 6/CT (CLIP) IMPLANT
CONT SPEC 4OZ CLIKSEAL STRL BL (MISCELLANEOUS) ×4 IMPLANT
COVER MAYO STAND STRL (DRAPES) IMPLANT
DECANTER SPIKE VIAL GLASS SM (MISCELLANEOUS) IMPLANT
DIFFUSER DRILL AIR PNEUMATIC (MISCELLANEOUS) ×4 IMPLANT
DRAIN SUBARACHNOID (WOUND CARE) IMPLANT
DRAPE HALF SHEET 40X57 (DRAPES) ×4 IMPLANT
DRAPE MICROSCOPE LEICA (MISCELLANEOUS) ×4 IMPLANT
DRAPE NEUROLOGICAL W/INCISE (DRAPES) ×4 IMPLANT
DRAPE STERI IOBAN 125X83 (DRAPES) IMPLANT
DRAPE SURG 17X23 STRL (DRAPES) IMPLANT
DRAPE WARM FLUID 44X44 (DRAPE) ×4 IMPLANT
DRSG ADAPTIC 3X8 NADH LF (GAUZE/BANDAGES/DRESSINGS) IMPLANT
DRSG TELFA 3X8 NADH (GAUZE/BANDAGES/DRESSINGS) ×4 IMPLANT
DURAMATRIX ONLAY 3X3 (Plate) ×4 IMPLANT
DURAPREP 6ML APPLICATOR 50/CS (WOUND CARE) ×4 IMPLANT
ELECT REM PT RETURN 9FT ADLT (ELECTROSURGICAL) ×4
ELECTRODE REM PT RTRN 9FT ADLT (ELECTROSURGICAL) ×2 IMPLANT
EVACUATOR 1/8 PVC DRAIN (DRAIN) IMPLANT
EVACUATOR SILICONE 100CC (DRAIN) IMPLANT
FORCEPS BIPOLAR SPETZLER 8 1.0 (NEUROSURGERY SUPPLIES) ×4 IMPLANT
GAUZE SPONGE 4X4 12PLY STRL (GAUZE/BANDAGES/DRESSINGS) IMPLANT
GAUZE SPONGE 4X4 16PLY XRAY LF (GAUZE/BANDAGES/DRESSINGS) IMPLANT
GLOVE BIO SURGEON STRL SZ7 (GLOVE) IMPLANT
GLOVE BIOGEL PI IND STRL 6.5 (GLOVE) ×4 IMPLANT
GLOVE BIOGEL PI IND STRL 7.0 (GLOVE) ×2 IMPLANT
GLOVE BIOGEL PI IND STRL 7.5 (GLOVE) ×2 IMPLANT
GLOVE BIOGEL PI INDICATOR 6.5 (GLOVE) ×4
GLOVE BIOGEL PI INDICATOR 7.0 (GLOVE) ×2
GLOVE BIOGEL PI INDICATOR 7.5 (GLOVE) ×2
GLOVE ECLIPSE 7.0 STRL STRAW (GLOVE) ×8 IMPLANT
GLOVE EXAM NITRILE LRG STRL (GLOVE) IMPLANT
GLOVE EXAM NITRILE XL STR (GLOVE) IMPLANT
GLOVE EXAM NITRILE XS STR PU (GLOVE) IMPLANT
GLOVE SURG SS PI 6.5 STRL IVOR (GLOVE) ×8 IMPLANT
GOWN STRL REUS W/ TWL LRG LVL3 (GOWN DISPOSABLE) ×6 IMPLANT
GOWN STRL REUS W/ TWL XL LVL3 (GOWN DISPOSABLE) IMPLANT
GOWN STRL REUS W/TWL 2XL LVL3 (GOWN DISPOSABLE) IMPLANT
GOWN STRL REUS W/TWL LRG LVL3 (GOWN DISPOSABLE) ×6
GOWN STRL REUS W/TWL XL LVL3 (GOWN DISPOSABLE)
HEMOSTAT POWDER KIT SURGIFOAM (HEMOSTASIS) ×4 IMPLANT
HEMOSTAT SURGICEL 2X14 (HEMOSTASIS) ×4 IMPLANT
HOOK DURA 1/2IN (MISCELLANEOUS) ×4 IMPLANT
IV NS 1000ML (IV SOLUTION) ×2
IV NS 1000ML BAXH (IV SOLUTION) ×2 IMPLANT
KIT BASIN OR (CUSTOM PROCEDURE TRAY) ×4 IMPLANT
KIT DRAIN CSF ACCUDRAIN (MISCELLANEOUS) IMPLANT
KIT ROOM TURNOVER OR (KITS) ×4 IMPLANT
KNIFE ARACHNOID DISP AM-24-S (MISCELLANEOUS) ×4 IMPLANT
MARKER SPHERE PSV REFLC 13MM (MARKER) ×8 IMPLANT
NEEDLE HYPO 22GX1.5 SAFETY (NEEDLE) ×4 IMPLANT
NEEDLE SPNL 18GX3.5 QUINCKE PK (NEEDLE) IMPLANT
NS IRRIG 1000ML POUR BTL (IV SOLUTION) ×12 IMPLANT
OIL CARTRIDGE MAESTRO DRILL (MISCELLANEOUS) ×4
PACK CRANIOTOMY (CUSTOM PROCEDURE TRAY) ×4 IMPLANT
PATTIES SURGICAL .25X.25 (GAUZE/BANDAGES/DRESSINGS) IMPLANT
PATTIES SURGICAL .5 X.5 (GAUZE/BANDAGES/DRESSINGS) ×4 IMPLANT
PATTIES SURGICAL .5 X3 (DISPOSABLE) IMPLANT
PATTIES SURGICAL 1/4 X 3 (GAUZE/BANDAGES/DRESSINGS) IMPLANT
PATTIES SURGICAL 1X1 (DISPOSABLE) IMPLANT
PIN MAYFIELD SKULL DISP (PIN) ×4 IMPLANT
PLATE 1.5  2HOLE LNG NEURO (Plate) ×4 IMPLANT
PLATE 1.5 2HOLE LNG NEURO (Plate) ×4 IMPLANT
PLATE 1.5/0.5 18.5MM BURR HOLE (Plate) ×4 IMPLANT
RUBBERBAND STERILE (MISCELLANEOUS) ×4 IMPLANT
SCREW SELF DRILL HT 1.5/4MM (Screw) ×36 IMPLANT
SET TUBING W/EXT DISP (INSTRUMENTS) ×4 IMPLANT
SPECIMEN JAR SMALL (MISCELLANEOUS) ×4 IMPLANT
SPONGE NEURO XRAY DETECT 1X3 (DISPOSABLE) IMPLANT
SPONGE SURGIFOAM ABS GEL 100 (HEMOSTASIS) ×4 IMPLANT
STAPLER VISISTAT 35W (STAPLE) ×4 IMPLANT
STOCKINETTE 6  STRL (DRAPES)
STOCKINETTE 6 STRL (DRAPES) IMPLANT
SUT ETHILON 3 0 FSL (SUTURE) IMPLANT
SUT ETHILON 3 0 PS 1 (SUTURE) IMPLANT
SUT NURALON 4 0 TR CR/8 (SUTURE) ×8 IMPLANT
SUT SILK 0 TIES 10X30 (SUTURE) IMPLANT
SUT VIC AB 0 CT1 18XCR BRD8 (SUTURE) ×4 IMPLANT
SUT VIC AB 0 CT1 8-18 (SUTURE) ×4
SUT VIC AB 3-0 SH 8-18 (SUTURE) ×12 IMPLANT
TIP STRAIGHT 25KHZ (INSTRUMENTS) IMPLANT
TOWEL GREEN STERILE (TOWEL DISPOSABLE) ×8 IMPLANT
TOWEL GREEN STERILE FF (TOWEL DISPOSABLE) ×4 IMPLANT
TRAY FOLEY W/METER SILVER 16FR (SET/KITS/TRAYS/PACK) ×4 IMPLANT
TUBE CONNECTING 12'X1/4 (SUCTIONS) ×1
TUBE CONNECTING 12X1/4 (SUCTIONS) ×3 IMPLANT
UNDERPAD 30X30 (UNDERPADS AND DIAPERS) IMPLANT
WATER STERILE IRR 1000ML POUR (IV SOLUTION) ×4 IMPLANT

## 2017-06-20 NOTE — Op Note (Signed)
PREOP DIAGNOSIS:  1. Right frontal cavernoma   POSTOP DIAGNOSIS: Same  PROCEDURE: 1. Stereotactic right frontal craniotomy for resection of cavernous malformation 2. Use of intraoperative microscope for microdissection  SURGEON: Dr. Consuella Lose, MD  ASSISTANT: Ferne Reus, PA-C  ANESTHESIA: General Endotracheal  EBL: 200cc  SPECIMENS: Right frontal cavernoma  DRAINS: None  COMPLICATIONS: None immediate  CONDITION: Stable to PACU  HISTORY: Shelly Quinn is a 26 y.o. female initially presenting to the emergency department and seen in inpatient consultation with headache, and grand mal seizure. CT scan and MRI demonstrated likely right frontal cavernous malformation. She made an excellent recovery, and was seen in the outpatient neurosurgery clinic where treatment options were discussed. She elected to proceed with surgical resection of the lesion. Risks and benefits of the operation were discussed in detail with the patient and her family. After all questions were answered informed consent was obtained and witnessed.  PROCEDURE IN DETAIL: After informed consent was obtained and witnessed, the patient was brought to the operating room. After induction of general anesthesia, the patient was positioned on the operative table in the [place] headholder in the supine position. All pressure points were meticulously padded. Utilizing the preoperative stereotactic MRI scan, surface markers were co-registered until satisfactory accuracy was achieved. This was used to plan out a standard frontotemporal incision in order to allow adequate access to the lesion. Skin incision was then marked out and prepped and draped in the usual sterile fashion.  After timeout was conducted, skin incision was infiltrated with local anesthetic with epinephrine. Incision was then made sharply and carried through the subcutaneous fat and the galea. Raney clips were applied for hemostasis on the skin  edges. Bovie left or cautery was then used to incise the periosteum, and the temporalis fascia was also incised as was the muscle. A single piece myocutaneous flap was then elevated and reflected anteriorly. At this point, high-speed drill was used to create a single bur hole at the posterior margin of the lesion, as identified by the stereotactic system. Craniotome was then used to fashion a single piece craniotomy flap. This was elevated and removed. Hemostasis on the dura was secured with morcellized Gelfoam and thrombin and bipolar electrocautery. The dura was then opened in stellate fashion, and good hemostasis was secured on the brain surface from a small bridging vein medially.  At this point, the intraoperative microscope was draped sterilely and brought into the field, and the remainder of the resection was carried out under the microscope using micro-dissection. Utilizing the stereotactic system, location of the closest projection to the cortical surface was identified. The pia was then coagulated with bipolar electrocautery. The pia was incised with scissors, and white matter was dissected with suction and bipolar electrocautery until the lesion was easily identified by its purple color. In meticulous fashion, using a combination of blunt dissectors, bipolar electrocautery, and suction, the lesion was circumferentially dissected. While large portion of the lesion appeared to be organizing hematoma, I did identify what did appear to clearly be cavernous malformation in the posterior medial portion of the resection. This lesion had the classic [place] appearance of the cavernous mouth formation. After circumferential dissection around the lesion which was relatively straightforward due to the presence of a hemosiderin ring, the lesion was removed en bloc and sent for permanent pathology.   Resection cavity was then inspected, and there did not appear to be any residual lesion. Hemostasis was then  secured with a combination of bipolar electrocautery and  morcellized Gelfoam and thrombin. The dura was then reapproximated after copious irrigation. A piece of collagen matrix was placed over the dural openings. Bone flap was then replaced with standard titanium plates and screws. Temporalis was then reapproximated with 0 Vicryl stitches, the galea was closed with interrupted 3-0 Vicryl stitches. Skin was closed with skin staples. Sterile dressing was applied. The patient was then removed from the [place] head holder, transferred to the stretcher, X abated, and taken to postanesthesia care unit in stable hemodynamic condition.  At the end of the case all sponge, needle, instrument, and cottonoid counts were correct.

## 2017-06-20 NOTE — Anesthesia Postprocedure Evaluation (Signed)
Anesthesia Post Note  Patient: Shelly Quinn  Procedure(s) Performed: Procedure(s) (LRB): STEREOTACTIC RIGHT FRONTAL CRANIOTOMY, RESECTION OF CAVERNOUS MALFORMATION (Right) APPLICATION OF CRANIAL NAVIGATION (N/A)     Patient location during evaluation: PACU Anesthesia Type: General Level of consciousness: awake and alert Pain management: pain level controlled Vital Signs Assessment: post-procedure vital signs reviewed and stable Respiratory status: spontaneous breathing, nonlabored ventilation, respiratory function stable and patient connected to nasal cannula oxygen Cardiovascular status: blood pressure returned to baseline and stable Postop Assessment: no signs of nausea or vomiting Anesthetic complications: no    Last Vitals:  Vitals:   06/20/17 1130 06/20/17 1200  BP: 112/70 101/74  Pulse: 70 67  Resp: 12 15  Temp: 36.8 C   SpO2: 96% 97%    Last Pain:  Vitals:   06/20/17 1234  TempSrc:   PainSc: 6                  Tiajuana Amass

## 2017-06-20 NOTE — Addendum Note (Signed)
Addendum  created 06/20/17 1455 by Shirlyn Goltz, CRNA   Anesthesia Intra Meds edited

## 2017-06-20 NOTE — Anesthesia Procedure Notes (Signed)
Procedure Name: Intubation Date/Time: 06/20/2017 7:49 AM Performed by: Shirlyn Goltz Pre-anesthesia Checklist: Patient identified, Emergency Drugs available, Suction available and Patient being monitored Patient Re-evaluated:Patient Re-evaluated prior to induction Oxygen Delivery Method: Circle system utilized Preoxygenation: Pre-oxygenation with 100% oxygen Induction Type: IV induction Ventilation: Mask ventilation without difficulty Laryngoscope Size: Mac and 3 Grade View: Grade I Tube type: Oral Tube size: 7.0 mm Number of attempts: 1 Airway Equipment and Method: Stylet Placement Confirmation: ETT inserted through vocal cords under direct vision,  positive ETCO2 and breath sounds checked- equal and bilateral Secured at: 21 cm Tube secured with: Tape Dental Injury: Teeth and Oropharynx as per pre-operative assessment

## 2017-06-20 NOTE — Transfer of Care (Signed)
Immediate Anesthesia Transfer of Care Note  Patient: Shelly Quinn  Procedure(s) Performed: Procedure(s) with comments: STEREOTACTIC RIGHT FRONTAL CRANIOTOMY, RESECTION OF CAVERNOUS MALFORMATION (Right) APPLICATION OF CRANIAL NAVIGATION (N/A) - APPLICATION OF CRANIAL NAVIGATION  Patient Location: PACU  Anesthesia Type:General  Level of Consciousness: awake, alert , oriented and patient cooperative  Airway & Oxygen Therapy: Patient Spontanous Breathing and Patient connected to nasal cannula oxygen  Post-op Assessment: Report given to RN and Post -op Vital signs reviewed and stable  Post vital signs: Reviewed and stable  Last Vitals:  Vitals:   06/20/17 0548  BP: 98/62  Pulse: (!) 59  Resp: 18  Temp: 36.7 C  SpO2: 98%    Last Pain:  Vitals:   06/20/17 0548  TempSrc: Oral         Complications: No apparent anesthesia complications

## 2017-06-20 NOTE — Plan of Care (Signed)
Problem: Safety: Goal: Ability to remain free from injury will improve Outcome: Progressing Have discussed safety with patient and family. Education has been provided concerning calling for assistance with movement. 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

## 2017-06-20 NOTE — Anesthesia Procedure Notes (Signed)
Arterial Line Insertion Start/End8/28/2018 7:05 AM, 06/20/2017 7:10 AM Performed by: Dalbert Mayotte, CRNA  Patient location: Pre-op. Preanesthetic checklist: patient identified, IV checked, site marked, risks and benefits discussed, surgical consent, monitors and equipment checked and pre-op evaluation Lidocaine 1% used for infiltration Left, radial was placed Catheter size: 20 G Hand hygiene performed  and maximum sterile barriers used  Allen's test indicative of satisfactory collateral circulation Attempts: 1 Procedure performed without using ultrasound guided technique. Ultrasound Notes:anatomy identified, needle tip was noted to be adjacent to the nerve/plexus identified and no ultrasound evidence of intravascular and/or intraneural injection Following insertion, dressing applied and Biopatch. Post procedure assessment: normal  Patient tolerated the procedure well with no immediate complications.

## 2017-06-20 NOTE — H&P (Signed)
Chief Complaint  Seizure, brain hemorrhage  History of Present Illness  Shelly Quinn is a 26 y.o. female initially seen in consultation in the hospital after suffering a headache with grand mal seizure back at the beginning of July. CT scan demonstrated right frontal hemorrhage. Further workup included MRI scan which was consistent with cavernous mouth formation. She made an excellent recovery and was seen in the outpatient neurosurgery clinic where treatment options were discussed. She elected to proceed with surgical resection of the cavernous malformation for prevention of further hemorrhage leading to a neurologic deficit, and prevention of further seizures/epilepsy.  Past Medical History   Past Medical History:  Diagnosis Date  . Anxiety   . Headache   . Seizures ([place])    July 13.2018  last seizure    Past Surgical History   Past Surgical History:  Procedure Laterality Date  . WISDOM TOOTH EXTRACTION      Social History   Social History  Substance Use Topics  . Smoking status: Current Every Day Smoker    Packs/day: 0.30    Years: 4.00    Types: Cigarettes  . Smokeless tobacco: Never Used  . Alcohol use Yes     Comment: occasionally    Medications   Prior to Admission medications   Medication Sig Start Date End Date Taking? Authorizing Provider  ibuprofen (ADVIL,MOTRIN) 200 MG tablet Take 400 mg by mouth every 8 (eight) hours as needed for headache or mild pain.    Yes [provider]  levETIRAcetam (KEPPRA) 500 MG tablet Take 1 tablet (500 mg total) by mouth 2 (two) times daily. 05/06/17  Yes Lady Deutscher, MD    Allergies  No Known Allergies  Review of Systems  ROS  Neurologic Exam  Awake, alert, oriented Memory and concentration grossly intact Speech fluent, appropriate CN grossly intact Motor exam: Upper Extremities Deltoid Bicep Tricep Grip  Right 5/5 5/5 5/5 5/5  Left 5/5 5/5 5/5 5/5   Lower Extremities IP Quad PF DF EHL  Right  5/5 5/5 5/5 5/5 5/5  Left 5/5 5/5 5/5 5/5 5/5   Sensation grossly intact to LT  Imaging  Most recent MRI demonstrates popcorn-appearing lesion in the right parasagittal frontal lobe consistent with cavernous malformation. Appears slightly increased in comparison to prior MRI of July. There is also slight increase in surrounding edema. There is no significant local mass effect, effacement of the ventricle, or midline shift.  Impression  - 26 y.o. 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  Plan  - Plan on proceeding with stereotactic right frontal craniotomy for resection of cavernous malformation  I have reviewed the risks, benefits, and alternatives to surgical resection with the patient and her family in the office. All questions were answered and consent was obtained.

## 2017-06-21 ENCOUNTER — Encounter (HOSPITAL_COMMUNITY): Payer: Self-pay | Admitting: Neurosurgery

## 2017-06-21 LAB — BASIC METABOLIC PANEL
Anion gap: 9 (ref 5–15)
BUN: 7 mg/dL (ref 6–20)
CO2: 22 mmol/L (ref 22–32)
Calcium: 8.9 mg/dL (ref 8.9–10.3)
Chloride: 104 mmol/L (ref 101–111)
Creatinine, Ser: 0.67 mg/dL (ref 0.44–1.00)
GFR calc Af Amer: >60 mL/min (ref >60)
GFR calc non Af Amer: >60 mL/min (ref >60)
Glucose, Bld: 154 mg/dL — ABNORMAL HIGH (ref 65–99)
Potassium: 4.1 mmol/L (ref 3.5–5.1)
Sodium: 135 mmol/L (ref 135–145)

## 2017-06-21 LAB — TYPE AND SCREEN
ABO/RH(D): A POS
Antibody Screen: NEGATIVE
UNIT DIVISION: 0
Unit division: 0

## 2017-06-21 LAB — BPAM RBC
BLOOD PRODUCT EXPIRATION DATE: 201809192359
Blood Product Expiration Date: 201809202359
UNIT TYPE AND RH: 6200
Unit Type and Rh: 6200

## 2017-06-21 LAB — CBC
HCT: 34.8 % — ABNORMAL LOW (ref 36.0–46.0)
Hemoglobin: 12.1 g/dL (ref 12.0–15.0)
MCH: 30.9 pg (ref 26.0–34.0)
MCHC: 34.8 g/dL (ref 30.0–36.0)
MCV: 89 fL (ref 78.0–100.0)
Platelets: 151 10*3/uL (ref 150–400)
RBC: 3.91 MIL/uL (ref 3.87–5.11)
RDW: 13.3 % (ref 11.5–15.5)
WBC: 13.9 10*3/uL — ABNORMAL HIGH (ref 4.0–10.5)

## 2017-06-21 MED ORDER — DIPHENHYDRAMINE HCL 25 MG PO CAPS: 25.0000 mg | ORAL_CAPSULE | Freq: Four times a day (QID) | ORAL | Status: DC | PRN

## 2017-06-21 MED ORDER — PANTOPRAZOLE SODIUM 40 MG PO TBEC
40.0000 mg | DELAYED_RELEASE_TABLET | Freq: Every day | ORAL | Status: DC
  Administered 2017-06-21: 40 mg via ORAL
  Filled 2017-06-21: qty 1

## 2017-06-21 MED ORDER — POTASSIUM CHLORIDE 10MEQ/50ML PEDIATRIC IV SOLN
10.0000 meq | INTRAVENOUS | Status: DC
  Filled 2017-06-21 (×2): qty 50

## 2017-06-22 MED ORDER — LEVETIRACETAM 500 MG PO TABS: 500.0000 mg | ORAL_TABLET | Freq: Two times a day (BID) | ORAL | 3 refills | Status: DC

## 2017-06-22 MED ORDER — PREDNISONE 10 MG (21) PO TBPK: ORAL_TABLET | ORAL | 0 refills | Status: DC

## 2017-06-22 MED ORDER — HYDROCODONE-ACETAMINOPHEN 5-325 MG PO TABS: 1.0000 | ORAL_TABLET | ORAL | 0 refills | Status: DC | PRN

## 2017-06-22 NOTE — Progress Notes (Signed)
No issues overnight. Pt has minimal HA. No visual changes, N/T/W.  EXAM:  BP 103/69   Pulse 64   Temp 98.2 F (36.8 C) (Oral)   Resp 18   Wt 50.3 kg (111 lb)   LMP 06/07/2017   SpO2 99%   BMI 19.05 kg/m   Awake, alert, oriented  Speech fluent, appropriate  CN grossly intact  5/5 BUE/BLE  Headwrap in place, c/d/i  IMPRESSION:  26 y.o. female POD#1 s/p resection right frontal cavernoma, doing well.  PLAN: - Mobilize today - d/c aline

## 2017-06-22 NOTE — Discharge Summary (Addendum)
Physician Discharge Summary  Patient ID: Shelly Quinn MRN: [id number] DOB/AGE: [birth year] 25 y.o.  Admit date: 06/20/2017 Discharge date: 06/22/2017  Admission Diagnoses:  Cavernoma  Discharge Diagnoses:  Same Active Problems:   Cavernoma   Discharged Condition: Stable  Hospital Course:  Shelly Quinn is a 26 y.o. female admitted after elective resection of right frontal cavernoma. She was at baseline postop, and monitored in the ICU for 2 nights. She was ambulating well without assistance, tolerating diet, and voiding normally. Pain was under control with oral medication.  Treatments: Surgery - Right frontal craniotomy for resection of cavernoma.  Discharge Exam: Blood pressure 103/69, pulse 64, temperature 98.2 F (36.8 C), temperature source Oral, resp. rate 18, weight 50.3 kg (111 lb), last menstrual period 06/07/2017, SpO2 99 %. Awake, alert, oriented Speech fluent, appropriate CN grossly intact 5/5 BUE/BLE Wound c/d/i  Disposition: 01-Home or Self Care  Discharge Instructions    Call MD for:  redness, tenderness, or signs of infection (pain, swelling, redness, odor or green/yellow discharge around incision site)    Complete by:  As directed    Call MD for:  temperature >100.4    Complete by:  As directed    Diet - low sodium heart healthy    Complete by:  As directed    Discharge instructions    Complete by:  As directed    Walk at home as much as possible, at least 4 times / day   Increase activity slowly    Complete by:  As directed    Lifting restrictions    Complete by:  As directed    No lifting > 10 lbs   May shower / Bathe    Complete by:  As directed    48 hours after surgery   May walk up steps    Complete by:  As directed    No dressing needed    Complete by:  As directed    Other Restrictions    Complete by:  As directed    No bending/twisting at waist     Allergies as of 06/22/2017   No Known Allergies     Medication List     STOP taking these medications   ibuprofen 200 MG tablet Commonly known as:  ADVIL,MOTRIN     TAKE these medications   HYDROcodone-acetaminophen 5-325 MG tablet Commonly known as:  NORCO/VICODIN Take 1 tablet by mouth every 4 (four) hours as needed for moderate pain.   levETIRAcetam 500 MG tablet Commonly known as:  KEPPRA Take 1 tablet (500 mg total) by mouth 2 (two) times daily.   predniSONE 10 MG (21) Tbpk tablet Commonly known as:  STERAPRED UNI-PAK 21 TAB Take as directed on package            Discharge Care Instructions        Start     Ordered   06/22/17 0000  HYDROcodone-acetaminophen (NORCO/VICODIN) 5-325 MG tablet  Every 4 hours PRN     06/22/17 0834   06/22/17 0000  levETIRAcetam (KEPPRA) 500 MG tablet  2 times daily     06/22/17 0834   06/22/17 0000  Discharge instructions    Comments:  Walk at home as much as possible, at least 4 times / day   06/22/17 0834   06/22/17 0000  Increase activity slowly     06/22/17 0834   06/22/17 0000  May walk up steps     06/22/17 0834   06/22/17 0000  May  shower / Bathe    Comments:  48 hours after surgery   06/22/17 0834   06/22/17 0000  Lifting restrictions    Comments:  No lifting > 10 lbs   06/22/17 0834   06/22/17 0000  Other Restrictions    Comments:  No bending/twisting at waist   06/22/17 0834   06/22/17 0000  Diet - low sodium heart healthy     06/22/17 0834   06/22/17 0000  No dressing needed     06/22/17 0834   06/22/17 0000  Call MD for:  temperature >100.4     06/22/17 0834   06/22/17 0000  Call MD for:  redness, tenderness, or signs of infection (pain, swelling, redness, odor or green/yellow discharge around incision site)     06/22/17 0834   06/22/17 0000  predniSONE (STERAPRED UNI-PAK 21 TAB) 10 MG (21) TBPK tablet     06/22/17 8250     Follow-up Information    Consuella Lose, MD. Schedule an appointment as soon as possible for a visit in 2 week(s).   Specialty:  Neurosurgery Why:  For  staple removal Contact information: [address] [place] [address] [phone]           Signed: Jairo Ben 06/22/2017, 8:44 AM

## 2017-06-22 NOTE — Progress Notes (Signed)
No issues overnight. Minimal HA. Walking well yesterday. Tolerating diet.  EXAM:  BP 103/69   Pulse 64   Temp 98.2 F (36.8 C) (Oral)   Resp 18   Wt 50.3 kg (111 lb)   LMP 06/07/2017   SpO2 99%   BMI 19.05 kg/m   Awake, alert, oriented  Speech fluent, appropriate  CN grossly intact  5/5 BUE/BLE  Wound c/d/i  IMPRESSION:  26 y.o. female POD#2 s/p resection right frontal cavernoma, doing well.  PLAN: - D/C home today

## 2017-06-29 ENCOUNTER — Encounter (HOSPITAL_COMMUNITY): Payer: Self-pay | Admitting: Emergency Medicine

## 2017-06-29 ENCOUNTER — Emergency Department (HOSPITAL_COMMUNITY)
Admission: EM | Admit: 2017-06-29 | Discharge: 2017-06-29 | Disposition: A | Discharge status: Home / Self Care | Payer: BLUE CROSS/BLUE SHIELD | Attending: Emergency Medicine | Admitting: Emergency Medicine

## 2017-06-29 DIAGNOSIS — L7682 Other postprocedural complications of skin and subcutaneous tissue: Secondary | ICD-10-CM | POA: Insufficient documentation

## 2017-06-29 DIAGNOSIS — F17210 Nicotine dependence, cigarettes, uncomplicated: Secondary | ICD-10-CM | POA: Diagnosis not present

## 2017-06-29 NOTE — ED Provider Notes (Signed)
[facility] DEPT Provider Note   CSN: [id number] Arrival date & time: 06/29/17  1419     History   Chief Complaint Chief Complaint  Patient presents with  . Incisional Pain    HPI Shelly Quinn is a 26 y.o. female with a recent history of seizures, right frontal cavernoma that was resected on 06/20/17 and has had an otherwise uneventful postoperative course. She reports that yesterday she noticed a rash on her chest, it does not itch, and she denies any new medicines over the past 4 days. She denies any new soaps, detergents, or lotions.  She reports that this morning when she woke up she has had increased pain around her incision along with a new area of mild swelling on her forehead.  She denies any shortness of breath, fevers, chills, nausea, visual changes. Reports that she otherwise feels like her normal baseline.  HPI  Past Medical History:  Diagnosis Date  . Anxiety   . Headache   . Seizures ([place])    July 13.2018  last seizure    Patient Active Problem List   Diagnosis Date Noted  . 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 Seizure ([place]) 05/05/2017  . 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 APPLICATION OF CRANIAL NAVIGATION N/A 06/20/2017   Procedure: APPLICATION OF CRANIAL NAVIGATION;  Surgeon: Consuella Lose, MD;  Location: [facility];  Service: Neurosurgery;  Laterality: N/A;  APPLICATION OF CRANIAL NAVIGATION  . CRANIOTOMY Right 06/20/2017   Procedure: STEREOTACTIC RIGHT FRONTAL CRANIOTOMY, RESECTION OF CAVERNOUS MALFORMATION;  Surgeon: Consuella Lose, MD;  Location: [facility];  Service: Neurosurgery;  Laterality: Right;  . WISDOM TOOTH EXTRACTION      OB History    No data available       Home Medications    Prior to Admission medications   Medication Sig Start Date End Date Taking? Authorizing Provider  HYDROcodone-acetaminophen (NORCO/VICODIN) 5-325 MG tablet Take 1 tablet by mouth every 4 (four) hours as needed for  moderate pain. 06/22/17   Consuella Lose, MD  levETIRAcetam (KEPPRA) 500 MG tablet Take 1 tablet (500 mg total) by mouth 2 (two) times daily. 06/22/17   Consuella Lose, MD  predniSONE (STERAPRED UNI-PAK 21 TAB) 10 MG (21) TBPK tablet Take as directed on package 06/22/17   Consuella Lose, MD    Family History No family history on file.  Social History Social History  Substance Use Topics  . Smoking status: Current Every Day Smoker    Packs/day: 0.30    Years: 4.00    Types: Cigarettes  . Smokeless tobacco: Never Used  . Alcohol use Yes     Comment: occasionally     Allergies   Patient has no known allergies.   Review of Systems Review of Systems  Constitutional: Negative for chills and fever.  HENT: Negative for ear pain and sore throat.   Eyes: Negative for pain and visual disturbance.  Respiratory: Negative for cough and shortness of breath.   Cardiovascular: Negative for chest pain and palpitations.  Gastrointestinal: Negative for abdominal pain and vomiting.  Genitourinary: Negative for dysuria and hematuria.  Musculoskeletal: Negative for arthralgias and back pain.  Skin: Positive for wound (To right anterior scalp, with small swelling around ). Negative for color change and rash.  Neurological: Negative for seizures and syncope.  All other systems reviewed and are negative.    Physical Exam Updated Vital Signs BP 120/78 (BP Location: Right Arm)   Pulse 91  Temp 98.4 F (36.9 C) (Oral)   Resp 16   LMP 06/07/2017   SpO2 100%   Physical Exam  Constitutional: She is oriented to person, place, and time. Vital signs are normal. She appears well-developed and well-nourished. She does not appear ill. No distress.  HENT:  Head: Normocephalic.  Mouth/Throat: Oropharynx is clear and moist.  Incision present to right anterior scalp, very minimal swelling noted to forehead.  Wound is not draining, appears clean, dry, intact, no obvious abnormal erythema,  edema, or warmth.   Mild ecchymosis present under right eye.   Eyes: Conjunctivae are normal. Right eye exhibits no discharge. Left eye exhibits no discharge. No scleral icterus.  Neck: Normal range of motion.  Cardiovascular: Normal rate and regular rhythm.   Pulmonary/Chest: Effort normal. No stridor. No respiratory distress.  Abdominal: She exhibits no distension.  Musculoskeletal: She exhibits no edema or deformity.  Neurological: She is alert and oriented to person, place, and time. She displays no tremor. She exhibits normal muscle tone. She displays no seizure activity.  Grossly intact.   Skin: Skin is warm and dry. She is not diaphoretic.  Blanching redness to superior anterior chest.   Psychiatric: She has a normal mood and affect. Her behavior is normal.  Nursing note and vitals reviewed.    ED Treatments / Results  Labs (all labs ordered are listed, but only abnormal results are displayed) Labs Reviewed - No data to display  EKG  EKG Interpretation None       Radiology No results found.  Procedures Procedures (including critical care time)  Medications Ordered in ED Medications - No data to display   Initial Impression / Assessment and Plan / ED Course  I have reviewed the triage vital signs and the nursing notes.  Pertinent labs & imaging results that were available during my care of the patient were reviewed by me and considered in my medical decision making (see chart for details).    Shelly Quinn presents for evaluation of increased incisional pain. She was seen in the emergency room by neurosurgery PA who reports he gave her discharge instructions and return precautions.  She is to follow up in their clinic as scheduled.  For her redness and reported rash she was instructed to monitor the area, and given rash specific return precautions.    At this time there does not appear to be any evidence of an acute emergency medical condition and the patient  appears stable for discharge with appropriate outpatient follow up.Diagnosis was discussed with patient who verbalizes understanding and is agreeable to discharge.   Final Clinical Impressions(s) / ED Diagnoses   Final diagnoses:  Incisional pain    New Prescriptions New Prescriptions   No medications on file     Ollen Gross 06/29/17 1813    Duffy Bruce, MD 06/30/17 [phone]

## 2017-06-29 NOTE — ED Notes (Signed)
Paged neurosurgery to let them know pt was in ED

## 2017-06-29 NOTE — Discharge Instructions (Signed)
Keep your follow up appointment.

## 2017-06-29 NOTE — ED Notes (Signed)
Mother up to front desk, concerned that patient's pain is getting worse. Patient is scheduled to get the next room.

## 2017-06-29 NOTE — ED Triage Notes (Addendum)
Pt states that she had surgery to her head on Tuesday of last week. Pt states that she began having swelling to the site yesterday. Pt denies headache or vision changes. Pt also reports a rash to her neck. Incision site appears clean and approximated well. Note written by nicole simmons rn

## 2017-06-29 NOTE — ED Notes (Signed)
Entire triage documented by Sarahy Creedon.

## 2017-06-29 NOTE — Consult Note (Signed)
Chief Complaint   Chief Complaint  Patient presents with  . Incisional Pain    HPI   HPI: Shelly Quinn is a 26 y.o. female who presented to ER for concern over craniotomy site. She underwent surgical resection of cavernoma by Dr Kathyrn Sheriff 06/20/2017 and discharged after obs 06/22/2017 doing well. Today she noticed some swelling at her forehead, just inferior to her surgical site so she came for evaluation. She feels "pressure" in that area but no headache otherwise. Denies redness, drainage or warmth at surgical site. No fever, chills, weakness, nausea, vomiting, photophobia. Feels well, just concerned about surgical site.  Patient Active Problem List   Diagnosis Date Noted  . 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 Seizure ([place]) 05/05/2017  . Frontal mass of brain 05/05/2017    PMH: Past Medical History:  Diagnosis Date  . Anxiety   . Headache   . Seizures ([place])    July 13.2018  last seizure    PSH: Past Surgical History:  Procedure Laterality Date  . APPLICATION OF CRANIAL NAVIGATION N/A 06/20/2017   Procedure: APPLICATION OF CRANIAL NAVIGATION;  Surgeon: Consuella Lose, MD;  Location: [facility];  Service: Neurosurgery;  Laterality: N/A;  APPLICATION OF CRANIAL NAVIGATION  . CRANIOTOMY Right 06/20/2017   Procedure: STEREOTACTIC RIGHT FRONTAL CRANIOTOMY, RESECTION OF CAVERNOUS MALFORMATION;  Surgeon: Consuella Lose, MD;  Location: [facility];  Service: Neurosurgery;  Laterality: Right;  . WISDOM TOOTH EXTRACTION       (Not in a hospital admission)  SH: Social History  Substance Use Topics  . Smoking status: Current Every Day Smoker    Packs/day: 0.30    Years: 4.00    Types: Cigarettes  . Smokeless tobacco: Never Used  . Alcohol use Yes     Comment: occasionally    MEDS: Prior to Admission medications   Medication Sig Start Date End Date Taking? Authorizing Provider  HYDROcodone-acetaminophen (NORCO/VICODIN) 5-325 MG tablet Take 1 tablet by mouth every 4 (four) hours as  needed for moderate pain. 06/22/17   Consuella Lose, MD  levETIRAcetam (KEPPRA) 500 MG tablet Take 1 tablet (500 mg total) by mouth 2 (two) times daily. 06/22/17   Consuella Lose, MD  predniSONE (STERAPRED UNI-PAK 21 TAB) 10 MG (21) TBPK tablet Take as directed on package 06/22/17   Consuella Lose, MD    ALLERGY: No Known Allergies  Social History  Substance Use Topics  . Smoking status: Current Every Day Smoker    Packs/day: 0.30    Years: 4.00    Types: Cigarettes  . Smokeless tobacco: Never Used  . Alcohol use Yes     Comment: occasionally     No family history on file.   ROS   Review of Systems  Constitutional: Negative for chills, fever and malaise/fatigue.  HENT: Negative.   Eyes: Negative for blurred vision, double vision and photophobia.  Gastrointestinal: Negative for nausea and vomiting.  Genitourinary: Negative.   Musculoskeletal: Negative.   Neurological: Negative for dizziness, tingling, tremors, sensory change, speech change, focal weakness, seizures, loss of consciousness, weakness and headaches.    Exam   Vitals:   06/29/17 1445  BP: 120/78  Pulse: 91  Resp: 16  Temp: 98.4 F (36.9 C)  SpO2: 100%   General appearance: WDWN, NAD, resting comfortably, playing on phone, nontoxic appearing Neurological Awake, alert, oriented Memory and concentration grossly intact Speech fluent, appropriate CNII: Visual fields normal CNIII/IV/VI: EOMI CNV: Facial sensation normal CNVII: Symmetric, normal strength CNVIII: Grossly normal CNIX: Normal palate movement CNXI: Trap  and SCM strength normal CN XII: Tongue protrusion normal Sensation grossly intact to LT  Craniotomy site: clean, dry, intact with staples. Small amount of swelling inferior to most anterior aspect of the crani site. No redness, drainage, warmth, palpable mass.   Results - Imaging/Labs   No results found for this or any previous visit (from the past 48 hour(s)).  No results  found.  Impression/Plan   26 y.o. female s/p craniotomy by Dr Kathyrn Sheriff with small amount of swelling inferior to surgical site. This does not appear to be infected. She has a scheduled appt next week for follow up. We discussed strong precautions for follow up at length. She will call for any concerns. Discussed with EDP.

## 2017-12-13 ENCOUNTER — Other Ambulatory Visit (HOSPITAL_COMMUNITY): Payer: Self-pay | Admitting: Neurosurgery

## 2017-12-13 DIAGNOSIS — Q283 Other malformations of cerebral vessels: Secondary | ICD-10-CM

## 2017-12-29 ENCOUNTER — Ambulatory Visit (HOSPITAL_COMMUNITY)
Admission: RE | Admit: 2017-12-29 | Discharge: 2017-12-29 | Disposition: A | Discharge status: Home / Self Care | Payer: 59 | Source: Ambulatory Visit | Attending: Neurosurgery | Admitting: Neurosurgery

## 2017-12-29 DIAGNOSIS — Q283 Other malformations of cerebral vessels: Secondary | ICD-10-CM | POA: Diagnosis present

## 2017-12-29 DIAGNOSIS — Z9889 Other specified postprocedural states: Secondary | ICD-10-CM | POA: Diagnosis not present

## 2018-03-19 ENCOUNTER — Emergency Department (HOSPITAL_COMMUNITY)
Admission: EM | Admit: 2018-03-19 | Discharge: 2018-03-19 | Disposition: A | Discharge status: Home / Self Care | Payer: 59 | Attending: Emergency Medicine | Admitting: Emergency Medicine

## 2018-03-19 ENCOUNTER — Encounter (HOSPITAL_COMMUNITY): Payer: Self-pay | Admitting: Emergency Medicine

## 2018-03-19 DIAGNOSIS — R110 Nausea: Secondary | ICD-10-CM | POA: Insufficient documentation

## 2018-03-19 DIAGNOSIS — F17210 Nicotine dependence, cigarettes, uncomplicated: Secondary | ICD-10-CM | POA: Insufficient documentation

## 2018-03-19 LAB — URINALYSIS, ROUTINE W REFLEX MICROSCOPIC
BILIRUBIN URINE: NEGATIVE
GLUCOSE, UA: NEGATIVE mg/dL
Hgb urine dipstick: NEGATIVE
Ketones, ur: 5 mg/dL — AB
Leukocytes, UA: NEGATIVE
Nitrite: NEGATIVE
PH: 7 (ref 5.0–8.0)
Protein, ur: NEGATIVE mg/dL
SPECIFIC GRAVITY, URINE: 1.011 (ref 1.005–1.030)

## 2018-03-19 LAB — CBC
HEMATOCRIT: 39.7 % (ref 36.0–46.0)
HEMOGLOBIN: 13.6 g/dL (ref 12.0–15.0)
MCH: 31.3 pg (ref 26.0–34.0)
MCHC: 34.3 g/dL (ref 30.0–36.0)
MCV: 91.3 fL (ref 78.0–100.0)
Platelets: 186 10*3/uL (ref 150–400)
RBC: 4.35 MIL/uL (ref 3.87–5.11)
RDW: 12.8 % (ref 11.5–15.5)
WBC: 6.1 10*3/uL (ref 4.0–10.5)

## 2018-03-19 LAB — COMPREHENSIVE METABOLIC PANEL
ALBUMIN: 4.4 g/dL (ref 3.5–5.0)
ALK PHOS: 49 U/L (ref 38–126)
ALT: 13 U/L — ABNORMAL LOW (ref 14–54)
ANION GAP: 8 (ref 5–15)
AST: 17 U/L (ref 15–41)
BUN: 9 mg/dL (ref 6–20)
CO2: 25 mmol/L (ref 22–32)
Calcium: 9.2 mg/dL (ref 8.9–10.3)
Chloride: 104 mmol/L (ref 101–111)
Creatinine, Ser: 0.82 mg/dL (ref 0.44–1.00)
GFR calc Af Amer: >60 mL/min (ref >60)
GFR calc non Af Amer: >60 mL/min (ref >60)
GLUCOSE: 96 mg/dL (ref 65–99)
POTASSIUM: 4 mmol/L (ref 3.5–5.1)
Sodium: 137 mmol/L (ref 135–145)
Total Bilirubin: 0.6 mg/dL (ref 0.3–1.2)
Total Protein: 7.2 g/dL (ref 6.5–8.1)

## 2018-03-19 LAB — I-STAT BETA HCG BLOOD, ED (MC, WL, AP ONLY): I-stat hCG, quantitative: <5 m[IU]/mL (ref <5)

## 2018-03-19 LAB — LIPASE, BLOOD: Lipase: 25 U/L (ref 11–51)

## 2018-03-19 MED ORDER — SODIUM CHLORIDE 0.9 % IV BOLUS
500.0000 mL | Freq: Once | INTRAVENOUS | Status: AC
  Administered 2018-03-19: 500 mL via INTRAVENOUS

## 2018-03-19 MED ORDER — METOCLOPRAMIDE HCL 5 MG/ML IJ SOLN
10.0000 mg | Freq: Once | INTRAMUSCULAR | Status: AC
  Administered 2018-03-19: 10 mg via INTRAVENOUS
  Filled 2018-03-19: qty 2

## 2018-03-19 MED ORDER — ONDANSETRON 4 MG PO TBDP: 4.0000 mg | ORAL_TABLET | Freq: Three times a day (TID) | ORAL | 0 refills | Status: DC | PRN

## 2018-03-19 NOTE — ED Triage Notes (Signed)
Patient here from home with complaints of nausea, vomiting since yesterday. Denies pregnancy. Also reports headache.

## 2018-03-19 NOTE — ED Notes (Signed)
ED Provider at bedside. 

## 2018-03-19 NOTE — ED Notes (Signed)
Patient tolerating PO fluids 

## 2018-03-19 NOTE — ED Notes (Signed)
Bed: WA08 Expected date:  Expected time:  Means of arrival:  Comments: 

## 2018-03-19 NOTE — ED Provider Notes (Signed)
[facility] DEPT Provider Note   CSN: [id number] Arrival date & time: 03/19/18  1047     History   Chief Complaint Chief Complaint  Patient presents with  . Nausea  . Emesis  . Headache    HPI Shelly Quinn is a 27 y.o. female.  The history is provided by the patient. No language interpreter was used.  Emesis   Associated symptoms include headaches.  Headache   Associated symptoms include vomiting.    Shelly Quinn is a 27 y.o. female who presents to the Emergency Department complaining of nausea/vomiting/HA. Yesterday she awoke with nausea, vomiting and mild headache. Throughout the day her headache worsened and she had ongoing vomiting and dry heaved. Her headache improved when she woke this morning but she did have some mild persistent headache. She took Tylenol at 5 AM and vomited again. 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 She does get intermittent episodes of nausea, vomiting and headache every few weeks over the last several months. She denies any fevers, visual changes, diarrhea, dysuria. She does have associated mild epigastric tenderness. She uses occasional marijuana, no additional drug or alcohol use.  Past Medical History:  Diagnosis Date  . Anxiety   . Headache   . Seizures ([place])    July 13.2018  last seizure    Patient Active Problem List   Diagnosis Date Noted  . 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 Seizure ([place]) 05/05/2017  . 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 APPLICATION OF CRANIAL NAVIGATION N/A 06/20/2017   Procedure: APPLICATION OF CRANIAL NAVIGATION;  Surgeon: Consuella Lose, MD;  Location: [facility];  Service: Neurosurgery;  Laterality: N/A;  APPLICATION OF CRANIAL NAVIGATION  . CRANIOTOMY Right 06/20/2017   Procedure: STEREOTACTIC RIGHT FRONTAL CRANIOTOMY, RESECTION OF CAVERNOUS MALFORMATION;  Surgeon:  Consuella Lose, MD;  Location: [facility];  Service: Neurosurgery;  Laterality: Right;  . WISDOM TOOTH EXTRACTION       OB History   None      Home Medications    Prior to Admission medications   Medication Sig Start Date End Date Taking? Authorizing Provider  acetaminophen (TYLENOL) 500 MG tablet Take 500 mg by mouth daily as needed for headache.   Yes [provider]  HYDROcodone-acetaminophen (NORCO/VICODIN) 5-325 MG tablet Take 1 tablet by mouth every 4 (four) hours as needed for moderate pain. Patient not taking: Reported on 03/19/2018 06/22/17   Consuella Lose, MD  levETIRAcetam (KEPPRA) 500 MG tablet Take 1 tablet (500 mg total) by mouth 2 (two) times daily. Patient not taking: Reported on 03/19/2018 06/22/17   Consuella Lose, MD  predniSONE (STERAPRED UNI-PAK 21 TAB) 10 MG (21) TBPK tablet Take as directed on package Patient not taking: Reported on 03/19/2018 06/22/17   Consuella Lose, MD    Family History No family history on file.  Social History Social History   Tobacco Use  . Smoking status: Current Every Day Smoker    Packs/day: 0.30    Years: 4.00    Pack years: 1.20    Types: Cigarettes  . Smokeless tobacco: Never Used  Substance Use Topics  . Alcohol use: Yes    Comment: occasionally  . Drug use: Yes    Comment: with in the last week     Allergies   Dilaudid [hydromorphone hcl]   Review of Systems Review of Systems  Gastrointestinal: Positive  for vomiting.  Neurological: Positive for headaches.  All other systems reviewed and are negative.    Physical Exam Updated Vital Signs BP 113/76 (BP Location: Left Arm)   Pulse 90   Temp 98.5 F (36.9 C) (Oral)   Resp 18   SpO2 100%   Physical Exam  Constitutional: She is oriented to person, place, and time. She appears well-developed and well-nourished. No distress.  HENT:  Head: Normocephalic and atraumatic.  Eyes: Pupils are equal, round, and reactive to light. Conjunctivae  and EOM are normal.  Cardiovascular: Normal rate and regular rhythm.  No murmur heard. Pulmonary/Chest: Effort normal and breath sounds normal. No respiratory distress.  Abdominal: Soft. There is no tenderness. There is no rebound and no guarding.  Musculoskeletal: She exhibits no edema or tenderness.  Neurological: She is alert and oriented to person, place, and time. No cranial nerve deficit. Coordination normal.  No pronator drift.  5/5 strength in all four extremities with sensation to light touch intact in all four extremities.    Skin: Skin is warm and dry.  Psychiatric: She has a normal mood and affect. Her behavior is normal.  Nursing note and vitals reviewed.    ED Treatments / Results  Labs (all labs ordered are listed, but only abnormal results are displayed) Labs Reviewed  COMPREHENSIVE METABOLIC PANEL - Abnormal; Notable for the following components:      Result Value   ALT 13 (*)    All other components within normal limits  URINALYSIS, ROUTINE W REFLEX MICROSCOPIC - Abnormal; Notable for the following components:   APPearance HAZY (*)    Ketones, ur 5 (*)    All other components within normal limits  LIPASE, BLOOD  CBC  I-STAT BETA HCG BLOOD, ED ([facility], [facility], [facility] ONLY)    EKG None  Radiology No results found.  Procedures Procedures (including critical care time)  Medications Ordered in ED Medications  metoCLOPramide (REGLAN) injection 10 mg (has no administration in time range)  sodium chloride 0.9 % bolus 500 mL (has no administration in time range)     Initial Impression / Assessment and Plan / ED Course  I have reviewed the triage vital signs and the nursing notes.  Pertinent labs & imaging results that were available during my care of the patient were reviewed by me and considered in my medical decision making (see chart for details).     Pt here with intermittent N/V/HA.  Abdominal exam is benign, no evidence of acute cholecystitis, appendicitis,  SBO.  Neurologic exam is wnl - presentation is not c/w recurrent tumor, SAH, meningitis.  HA resolved following treatment in ED.  Discussed home care, outpatient follow up and return precautions.   Final Clinical Impressions(s) / ED Diagnoses   Final diagnoses:  None    ED Discharge Orders    None       Quintella Reichert, MD 03/20/18 [phone]

## 2018-03-19 NOTE — ED Notes (Signed)
Patient given sprite.

## 2018-11-27 IMAGING — MR MR HEAD W/O CM
9 of 10 series · 38 of 48 positions shown · non-contrast
Comparison: 06/13/2017

CLINICAL DATA: Postop cavernoma resection

EXAM:
MRI HEAD WITHOUT CONTRAST
TECHNIQUE: Multiplanar, multiecho pulse sequences of the brain and surrounding
structures were obtained without intravenous contrast.

[Series 3: DWI · axial · 3.0mm · 1.09mm/px · z∈[-59,+73]mm · 11 of 100 slices shown (1 of 4)]
[im 1/100]
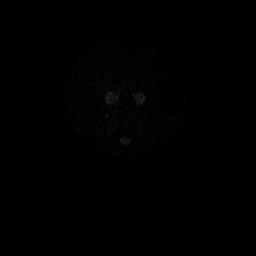
[im 10/100]
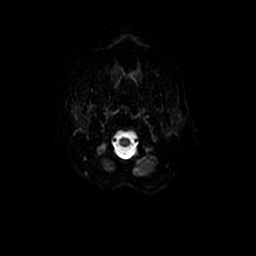
[im 20/100]
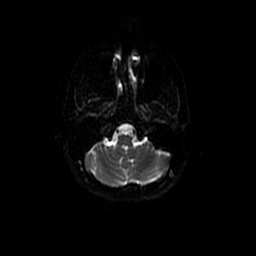
[im 30/100]
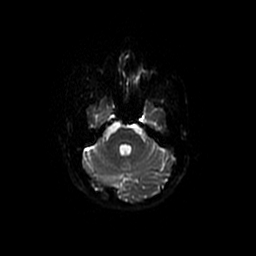
[im 40/100]
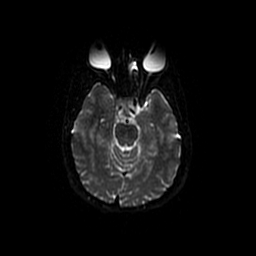
[im 50/100]
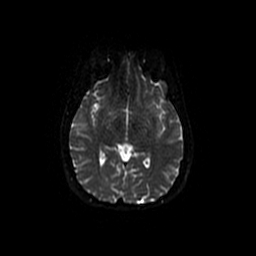
[im 60/100]
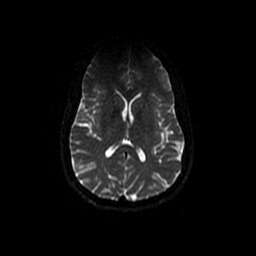
[im 70/100]
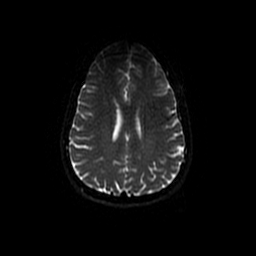
[im 80/100]
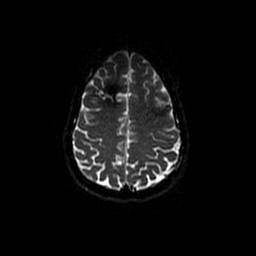
[im 90/100]
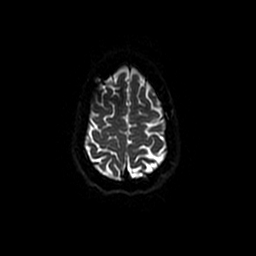
[im 100/100]
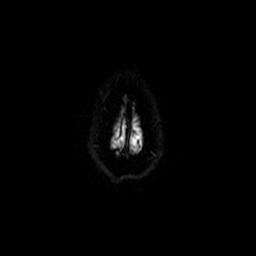

[Series 4: FLAIR · axial · 3.0mm · 0.47mm/px · z∈[-51,+78]mm · 2 of 25 slices shown]
[im 1/25]
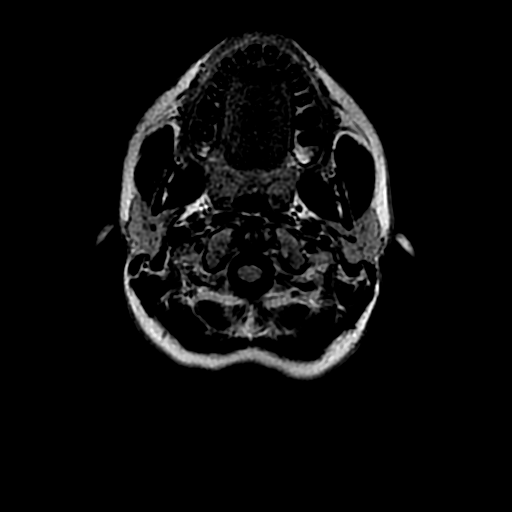
[im 25/25]
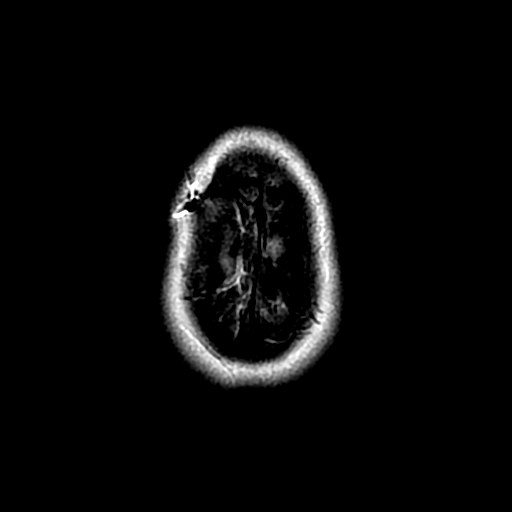

[Series 5: ax mpgr · axial · 5.0mm · 0.47mm/px · z∈[-51,+78]mm · 2 of 25 slices shown]
[im 1/25]
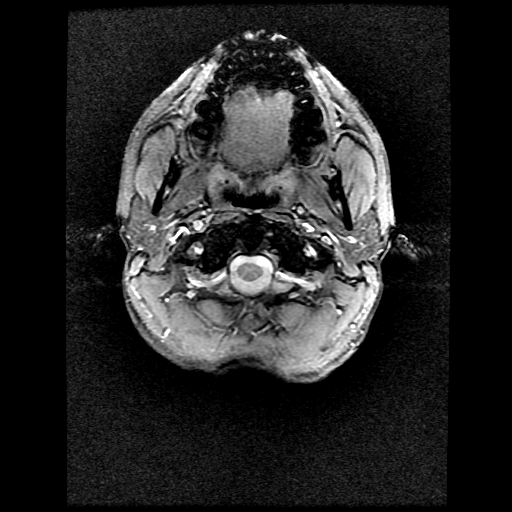
[im 25/25]
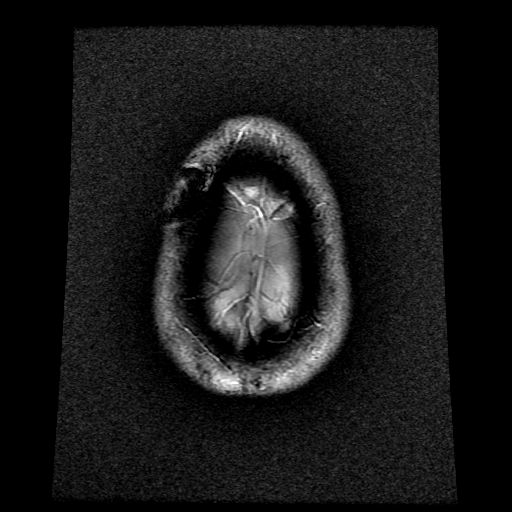

[Series 6: T2 · axial · 5.0mm · 0.47mm/px · z∈[-51,+78]mm · 2 of 25 slices shown (1 of 2)]
[im 1/25]
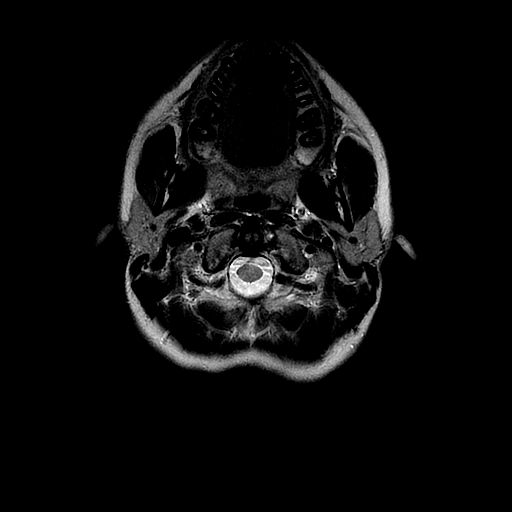
[im 25/25]
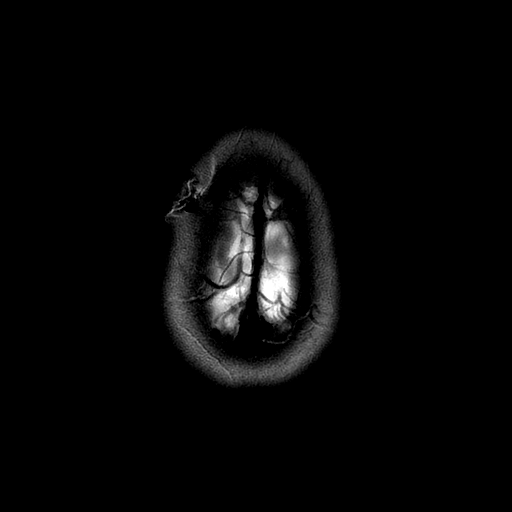

[Series 7: DWI · coronal · 5.0mm · 1.09mm/px · 7 of 72 slices shown (2 of 4)]
[im 1/72]
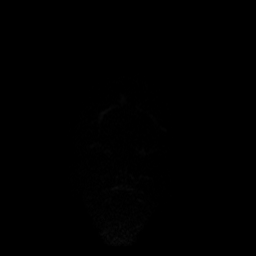
[im 12/72]
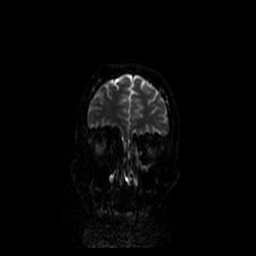
[im 24/72]
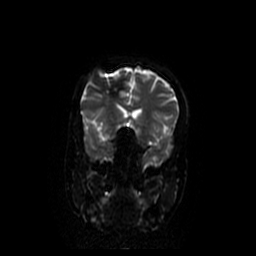
[im 36/72]
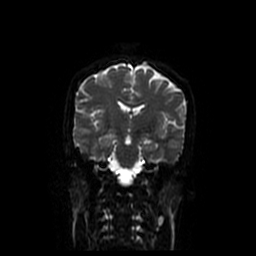
[im 48/72]
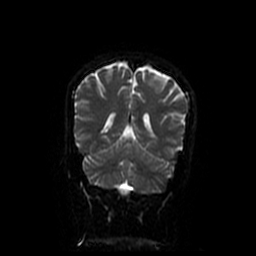
[im 60/72]
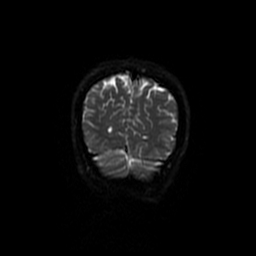
[im 72/72]
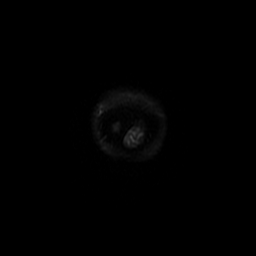

[Series 8: T1 · sagittal · 5.0mm · 0.47mm/px · 2 of 23 slices shown]
[im 1/23]
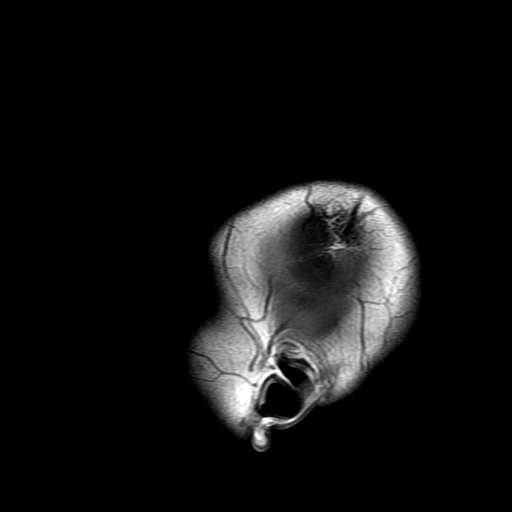
[im 23/23]
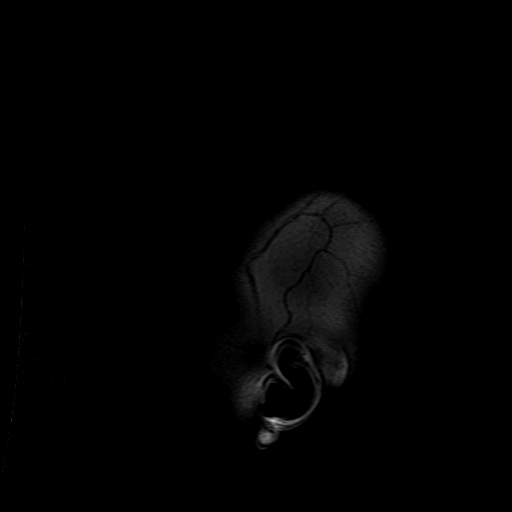

[Series 10: T2 · coronal · 5.0mm · 0.39mm/px · 3 of 30 slices shown (2 of 2)]
[im 1/30]
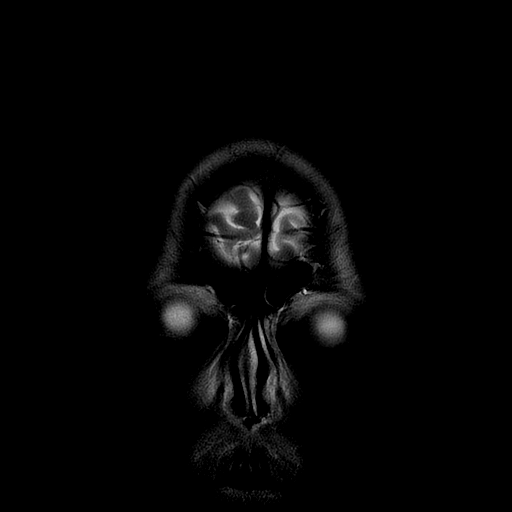
[im 15/30]
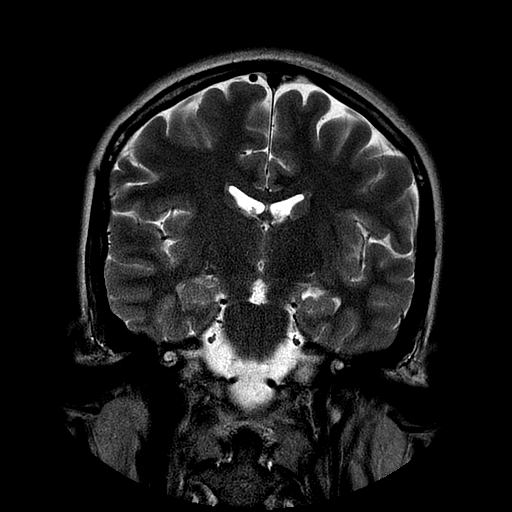
[im 30/30]
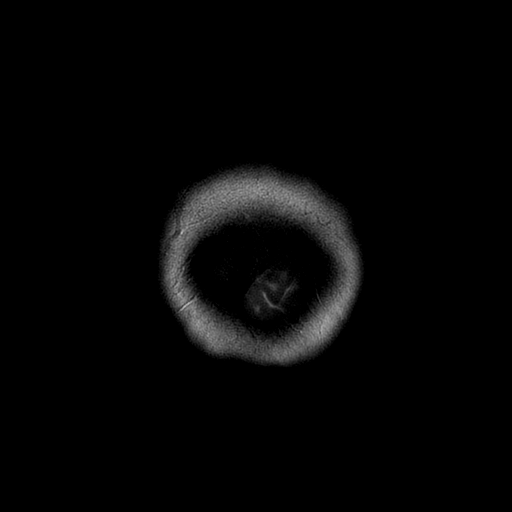

[Series 300: DWI · axial · 3.0mm · 1.09mm/px · z∈[-59,+73]mm · 5 of 50 slices shown (3 of 4)]
[im 1/50]
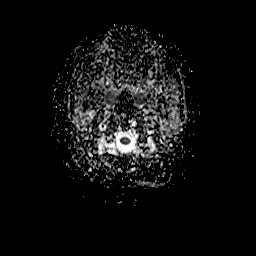
[im 13/50]
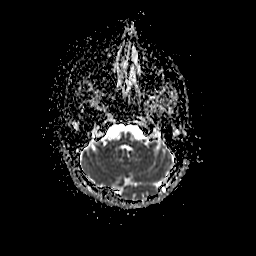
[im 25/50]
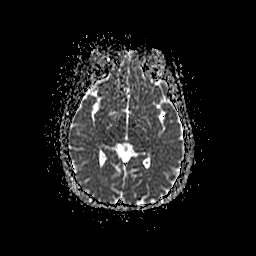
[im 37/50]
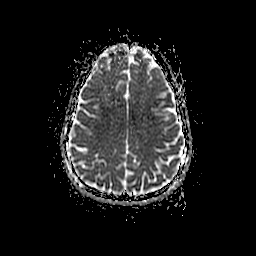
[im 50/50]
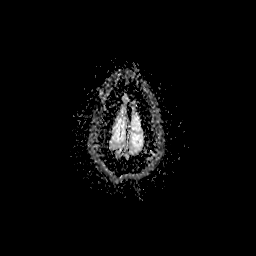

[Series 700: DWI · coronal · 5.0mm · 1.09mm/px · 4 of 36 slices shown (4 of 4)]
[im 1/36]
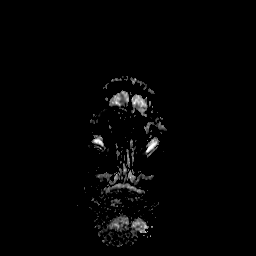
[im 12/36]
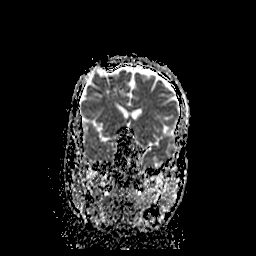
[im 24/36]
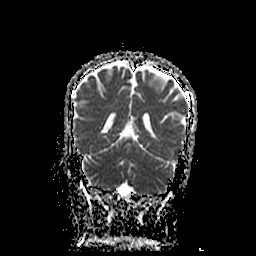
[im 36/36]
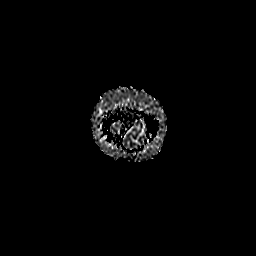

[38 of 48 positions shown; findings below may reference images not displayed]

FINDINGS: Brain: Interval resection of right frontal cavernoma with
hemosiderin in the area of surgery. No measurable rounded masslike
or T1 hyperintense residual. There is resolved mass effect and
edema.

Elsewhere the brain is normal with no infarct, hemorrhage,
hydrocephalus, or collection.

Vascular: Major flow voids are preserved.

Skull and upper cervical spine: Unremarkable right frontal
craniotomy.

Sinuses/Orbits: Left concha bullosa and ethmoid sinusitis.
IMPRESSION: Interval resection of right frontal cavernoma. No residual mass
effect or edema.

## 2020-05-07 LAB — OB RESULTS CONSOLE RPR: RPR: NONREACTIVE

## 2020-05-07 LAB — OB RESULTS CONSOLE GC/CHLAMYDIA
Chlamydia: NEGATIVE
Gonorrhea: NEGATIVE

## 2020-05-07 LAB — OB RESULTS CONSOLE HIV ANTIBODY (ROUTINE TESTING): HIV: NONREACTIVE

## 2020-05-07 LAB — OB RESULTS CONSOLE HEPATITIS B SURFACE ANTIGEN: Hepatitis B Surface Ag: NEGATIVE

## 2020-05-07 LAB — OB RESULTS CONSOLE RUBELLA ANTIBODY, IGM: Rubella: IMMUNE

## 2020-08-27 LAB — OB RESULTS CONSOLE HIV ANTIBODY (ROUTINE TESTING): HIV: NONREACTIVE

## 2020-08-27 LAB — OB RESULTS CONSOLE RPR: RPR: NONREACTIVE

## 2020-10-22 LAB — OB RESULTS CONSOLE GBS: GBS: NEGATIVE

## 2020-10-24 NOTE — L&D Delivery Note (Signed)
DELIVERY NOTE  Pt complete and at +2 station with urge to push. Epidural controlling pain. Pt pushed and delivered a viable female infant in ROA position. Anterior and posterior shoulders spontaneously delivered with next two pushes; body easily followed next. Infant placed on mothers abdomen and bulb suction of mouth and nose performed. Cord was then clamped and cut by FOB. Cord blood obtained, 3VC. Baby had a vigorous spontaneous cry noted. Placenta then delivered at 1900 intact. Fundal massage performed and pitocin per protocol. Fundus firm. The following lacerations were noted: left sulcal. Repaired in routine fashion with 2-0 vicryl Mother and baby stable. Counts correct. EBL 250cc  Infant time: 18 Gender: female Placenta time: 1900 Apgars: 9/9 Weight: pending skin-to-skin

## 2020-11-06 ENCOUNTER — Telehealth (HOSPITAL_COMMUNITY): Payer: Self-pay | Admitting: *Deleted

## 2020-11-06 ENCOUNTER — Encounter (HOSPITAL_COMMUNITY): Payer: Self-pay | Admitting: *Deleted

## 2020-11-06 NOTE — Telephone Encounter (Signed)
Preadmission screen  

## 2020-11-09 ENCOUNTER — Telehealth (HOSPITAL_COMMUNITY): Payer: Self-pay | Admitting: *Deleted

## 2020-11-09 ENCOUNTER — Encounter (HOSPITAL_COMMUNITY): Payer: Self-pay | Admitting: *Deleted

## 2020-11-09 NOTE — Telephone Encounter (Signed)
Preadmission screen  

## 2020-11-14 ENCOUNTER — Other Ambulatory Visit (HOSPITAL_COMMUNITY): Admission: RE | Admit: 2020-11-14 | Payer: Medicaid Other | Source: Ambulatory Visit

## 2020-11-16 ENCOUNTER — Inpatient Hospital Stay (HOSPITAL_COMMUNITY): Payer: Medicaid Other

## 2020-11-16 ENCOUNTER — Inpatient Hospital Stay (HOSPITAL_COMMUNITY)
Admission: AD | Admit: 2020-11-16 | Payer: Medicaid Other | Source: Home / Self Care | Admitting: Obstetrics and Gynecology

## 2020-11-18 ENCOUNTER — Encounter (HOSPITAL_COMMUNITY): Payer: Self-pay | Admitting: Obstetrics and Gynecology

## 2020-11-18 ENCOUNTER — Inpatient Hospital Stay (HOSPITAL_COMMUNITY)
Admission: AD | Admit: 2020-11-18 | Discharge: 2020-11-20 | DRG: 805 | Disposition: A | Discharge status: Home / Self Care | Payer: Medicaid Other | Attending: Obstetrics and Gynecology | Admitting: Obstetrics and Gynecology

## 2020-11-18 ENCOUNTER — Inpatient Hospital Stay (HOSPITAL_COMMUNITY): Payer: Medicaid Other | Admitting: Anesthesiology

## 2020-11-18 ENCOUNTER — Other Ambulatory Visit: Payer: Self-pay

## 2020-11-18 DIAGNOSIS — U071 COVID-19: Secondary | ICD-10-CM | POA: Diagnosis present

## 2020-11-18 DIAGNOSIS — O26893 Other specified pregnancy related conditions, third trimester: Secondary | ICD-10-CM | POA: Diagnosis present

## 2020-11-18 DIAGNOSIS — Z3A40 40 weeks gestation of pregnancy: Secondary | ICD-10-CM

## 2020-11-18 DIAGNOSIS — O9852 Other viral diseases complicating childbirth: Secondary | ICD-10-CM | POA: Diagnosis present

## 2020-11-18 DIAGNOSIS — Z87891 Personal history of nicotine dependence: Secondary | ICD-10-CM

## 2020-11-18 LAB — CBC
HCT: 37.2 % (ref 36.0–46.0)
Hemoglobin: 13.2 g/dL (ref 12.0–15.0)
MCH: 31.7 pg (ref 26.0–34.0)
MCHC: 35.5 g/dL (ref 30.0–36.0)
MCV: 89.2 fL (ref 80.0–100.0)
Platelets: 156 10*3/uL (ref 150–400)
RBC: 4.17 MIL/uL (ref 3.87–5.11)
RDW: 13.2 % (ref 11.5–15.5)
WBC: 8.8 10*3/uL (ref 4.0–10.5)
nRBC: 0 % (ref 0.0–0.2)

## 2020-11-18 LAB — TYPE AND SCREEN
ABO/RH(D): A POS
Antibody Screen: NEGATIVE

## 2020-11-18 MED ORDER — ONDANSETRON HCL 4 MG PO TABS: 4.0000 mg | ORAL_TABLET | ORAL | Status: DC | PRN

## 2020-11-18 MED ORDER — WITCH HAZEL-GLYCERIN EX PADS: 1.0000 "application " | MEDICATED_PAD | CUTANEOUS | Status: DC | PRN

## 2020-11-18 MED ORDER — FENTANYL-BUPIVACAINE-NACL 0.5-0.125-0.9 MG/250ML-% EP SOLN: 12.0000 mL/h | EPIDURAL | Status: DC | PRN

## 2020-11-18 MED ORDER — LACTATED RINGERS IV SOLN: INTRAVENOUS | Status: DC

## 2020-11-18 MED ORDER — LACTATED RINGERS IV SOLN: 500.0000 mL | Freq: Once | INTRAVENOUS | Status: DC

## 2020-11-18 MED ORDER — FENTANYL-BUPIVACAINE-NACL 0.5-0.125-0.9 MG/250ML-% EP SOLN
EPIDURAL | Status: DC | PRN
  Administered 2020-11-18: 12 mL/h via EPIDURAL

## 2020-11-18 MED ORDER — EPHEDRINE 5 MG/ML INJ: 10.0000 mg | INTRAVENOUS | Status: DC | PRN

## 2020-11-18 MED ORDER — ACETAMINOPHEN 325 MG PO TABS: 650.0000 mg | ORAL_TABLET | ORAL | Status: DC | PRN

## 2020-11-18 MED ORDER — BENZOCAINE-MENTHOL 20-0.5 % EX AERO
1.0000 | INHALATION_SPRAY | CUTANEOUS | Status: DC | PRN
Start: 2020-11-18 — End: 2020-11-20

## 2020-11-18 MED ORDER — SENNOSIDES-DOCUSATE SODIUM 8.6-50 MG PO TABS
2.0000 | ORAL_TABLET | Freq: Every day | ORAL | Status: DC
  Administered 2020-11-19: 2 via ORAL
  Filled 2020-11-18: qty 2

## 2020-11-18 MED ORDER — ZOLPIDEM TARTRATE 5 MG PO TABS: 5.0000 mg | ORAL_TABLET | Freq: Every evening | ORAL | Status: DC | PRN

## 2020-11-18 MED ORDER — SIMETHICONE 80 MG PO CHEW: 80.0000 mg | CHEWABLE_TABLET | ORAL | Status: DC | PRN

## 2020-11-18 MED ORDER — TETANUS-DIPHTH-ACELL PERTUSSIS 5-2.5-18.5 LF-MCG/0.5 IM SUSY: 0.5000 mL | PREFILLED_SYRINGE | Freq: Once | INTRAMUSCULAR | Status: DC

## 2020-11-18 MED ORDER — DIBUCAINE (PERIANAL) 1 % EX OINT: 1.0000 "application " | TOPICAL_OINTMENT | CUTANEOUS | Status: DC | PRN

## 2020-11-18 MED ORDER — ONDANSETRON HCL 4 MG/2ML IJ SOLN: 4.0000 mg | Freq: Four times a day (QID) | INTRAMUSCULAR | Status: DC | PRN

## 2020-11-18 MED ORDER — ONDANSETRON HCL 4 MG/2ML IJ SOLN: 4.0000 mg | INTRAMUSCULAR | Status: DC | PRN

## 2020-11-18 MED ORDER — PHENYLEPHRINE 40 MCG/ML (10ML) SYRINGE FOR IV PUSH (FOR BLOOD PRESSURE SUPPORT): 80.0000 ug | PREFILLED_SYRINGE | INTRAVENOUS | Status: DC | PRN

## 2020-11-18 MED ORDER — FLEET ENEMA 7-19 GM/118ML RE ENEM: 1.0000 | ENEMA | RECTAL | Status: DC | PRN

## 2020-11-18 MED ORDER — LIDOCAINE HCL (PF) 1 % IJ SOLN
30.0000 mL | INTRAMUSCULAR | Status: AC | PRN
  Administered 2020-11-18: 30 mL via SUBCUTANEOUS
  Filled 2020-11-18: qty 30

## 2020-11-18 MED ORDER — FENTANYL-BUPIVACAINE-NACL 0.5-0.125-0.9 MG/250ML-% EP SOLN
EPIDURAL | Status: AC
  Filled 2020-11-18: qty 250

## 2020-11-18 MED ORDER — LACTATED RINGERS IV SOLN: 500.0000 mL | INTRAVENOUS | Status: DC | PRN

## 2020-11-18 MED ORDER — OXYTOCIN-SODIUM CHLORIDE 30-0.9 UT/500ML-% IV SOLN
2.5000 [IU]/h | INTRAVENOUS | Status: DC
  Filled 2020-11-18: qty 500

## 2020-11-18 MED ORDER — OXYTOCIN BOLUS FROM INFUSION
333.0000 mL | Freq: Once | INTRAVENOUS | Status: AC
  Administered 2020-11-18: 333 mL via INTRAVENOUS

## 2020-11-18 MED ORDER — DIPHENHYDRAMINE HCL 50 MG/ML IJ SOLN
12.5000 mg | INTRAMUSCULAR | Status: DC | PRN
Start: 2020-11-18 — End: 2020-11-18

## 2020-11-18 MED ORDER — SOD CITRATE-CITRIC ACID 500-334 MG/5ML PO SOLN: 30.0000 mL | ORAL | Status: DC | PRN

## 2020-11-18 MED ORDER — IBUPROFEN 600 MG PO TABS
600.0000 mg | ORAL_TABLET | Freq: Four times a day (QID) | ORAL | Status: DC
  Administered 2020-11-18 – 2020-11-20 (×6): 600 mg via ORAL
  Filled 2020-11-18 (×6): qty 1

## 2020-11-18 MED ORDER — DIPHENHYDRAMINE HCL 25 MG PO CAPS
25.0000 mg | ORAL_CAPSULE | Freq: Four times a day (QID) | ORAL | Status: DC | PRN
Start: 2020-11-18 — End: 2020-11-20

## 2020-11-18 MED ORDER — COCONUT OIL OIL
1.0000 | TOPICAL_OIL | Status: DC | PRN
Start: 2020-11-18 — End: 2020-11-20

## 2020-11-18 MED ORDER — LIDOCAINE HCL (PF) 1 % IJ SOLN
INTRAMUSCULAR | Status: DC | PRN
  Administered 2020-11-18: 12 mL via EPIDURAL

## 2020-11-18 MED ORDER — PRENATAL MULTIVITAMIN CH
1.0000 | ORAL_TABLET | Freq: Every day | ORAL | Status: DC
  Administered 2020-11-19: 1 via ORAL
  Filled 2020-11-18: qty 1

## 2020-11-18 NOTE — Lactation Note (Signed)
This note was copied from a baby's chart. Lactation Consultation Note Baby 3 hrs old + Covid mom. Mom has flat compressible nipples. Re-positioned mom, pillows placed for football hold. Placed baby in football hold. Finger stimulation of nipple to evert. Then latched baby. Demonstrated upper lip flange and lower lip flange and chin tug. Baby latched well, BF well. Baby suck and stop. Explained to mom baby needs a break every so many suckles. Discussed stimulation after 15 sec. Of rest at the breast. Baby has molding of the head. Mom kept touching head. Encouraged mom to touch cheeks or arm, not the head d/t could be sore.  Shells given strongly encouraging mom to wear in am. Hand pump attachment put on bottle for pre-pumping before latching. Mom shown how to use DEBP & how to disassemble, clean, & reassemble parts. Mom knows to pump q3h for 15-20 min.  Mom encouraged to feed baby 8-12 times/24 hours and with feeding cues.   Newborn feeding habits, behavior, STS, I&O, breast massage, spoon feeding, positioning, supply and demand discussed.  Encouraged to call if needs assistance or questions. Lactation brochure given.   Patient Name: Shelly Quinn EVOJJ'K Date: 11/18/2020 Reason for consult: Initial assessment;[place];Term Age:98 hours  Maternal Data Has patient been taught Hand Expression?: Yes Does the patient have breastfeeding experience prior to this delivery?: No  Feeding Feeding Type: Breast Fed  LATCH Score Latch: Repeated attempts needed to sustain latch, nipple held in mouth throughout feeding, stimulation needed to elicit sucking reflex.  Audible Swallowing: None  Type of Nipple: Flat  Comfort (Breast/Nipple): Soft / non-tender  Hold (Positioning): Assistance needed to correctly position infant at breast and maintain latch.  LATCH Score: 5  Interventions Interventions: Breast feeding basics reviewed;Adjust position;DEBP;Assisted with latch;Support  pillows;Skin to skin;Position options;Breast massage;Hand express;Pre-pump if needed;Shells;Breast compression;Hand pump  Lactation Tools Discussed/Used Tools: Shells;Pump Shell Type: Inverted Breast pump type: Double-Electric Breast Pump WIC Program: No Pump Education: Setup, frequency, and cleaning;Milk Storage Initiated by:: Allayne Stack RN IBCLC Date initiated:: 11/18/20   Consult Status Consult Status: Follow-up Date: 11/19/20 Follow-up type: In-patient    Theodoro Kalata 11/18/2020, 10:37 PM

## 2020-11-18 NOTE — Anesthesia Procedure Notes (Signed)
Epidural Patient location during procedure: OB Start time: 11/18/2020 5:31 PM End time: 11/18/2020 5:33 PM  Staffing Anesthesiologist: Lyn Hollingshead, MD Performed: anesthesiologist   Preanesthetic Checklist Completed: patient identified, IV checked, site marked, risks and benefits discussed, surgical consent, monitors and equipment checked, pre-op evaluation and timeout performed  Epidural Patient position: sitting Prep: DuraPrep and site prepped and draped Patient monitoring: continuous pulse ox and blood pressure Approach: midline Location: L3-L4 Injection technique: LOR air  Needle:  Needle type: Tuohy  Needle gauge: 17 G Needle length: 9 cm and 9 Needle insertion depth: 5 cm cm Catheter type: closed end flexible Catheter size: 19 Gauge Catheter at skin depth: 10 cm Test dose: negative and Other  Assessment Events: blood not aspirated, injection not painful, no injection resistance, no paresthesia and negative IV test  Additional Notes Reason for block:procedure for pain

## 2020-11-18 NOTE — Progress Notes (Signed)
Patient very uncomfortable, now 9 with bulging bag, -1. Anticipate SVD, getting table ready

## 2020-11-18 NOTE — Progress Notes (Signed)
S/p epidural, more comfortable, occ pressure. Clear AROM @ 1745, ant lip/0 staiton

## 2020-11-18 NOTE — Plan of Care (Signed)
Maiana Hennigan, RN 

## 2020-11-18 NOTE — MAU Note (Signed)
Presents with c/o ctxs every 3 minutes since 1130 this morning.  Denies VB or LOF.  Endorses +FM.

## 2020-11-18 NOTE — Anesthesia Preprocedure Evaluation (Addendum)
Anesthesia Evaluation  Patient identified by MRN, date of birth, ID band Patient awake    Reviewed: Allergy & Precautions, H&P , NPO status , Patient's Chart, lab work & pertinent test results  Airway Mallampati: I       Dental no notable dental hx.    Pulmonary Patient abstained from smoking., former smoker,    Pulmonary exam normal        Cardiovascular negative cardio ROS Normal cardiovascular exam     Neuro/Psych negative psych ROS   GI/Hepatic negative GI ROS, Neg liver ROS,   Endo/Other  negative endocrine ROS  Renal/GU negative Renal ROS  negative genitourinary   Musculoskeletal   Abdominal Normal abdominal exam  (+)   Peds  Hematology negative hematology ROS (+)   Anesthesia Other Findings   Reproductive/Obstetrics (+) Pregnancy                            Anesthesia Physical Anesthesia Plan  ASA: II  Anesthesia Plan: Epidural   Post-op Pain Management:    Induction:   PONV Risk Score and Plan:   Airway Management Planned:   Additional Equipment: None  Intra-op Plan:   Post-operative Plan:   Informed Consent: I have reviewed the patients History and Physical, chart, labs and discussed the procedure including the risks, benefits and alternatives for the proposed anesthesia with the patient or authorized representative who has indicated his/her understanding and acceptance.       Plan Discussed with:   Anesthesia Plan Comments:        Anesthesia Quick Evaluation

## 2020-11-18 NOTE — H&P (Signed)
Shelly Quinn is a 30 y.o. female presenting for evaluation of contractions since 1130. Painful every 5 minutes. +FM, denies VB, LOF  PNC c/b recent COVID pos on 1/20, brain angioma removal in 2018 cleared for SVD. GBS neg  OB History    Gravida  1   Para      Term      Preterm      AB      Living        SAB      IAB      Ectopic      Multiple      Live Births             Past Medical History:  Diagnosis Date  . Anxiety   . Headache   . Seizures ([place])    July 13.2018  last seizure   Past Surgical History:  Procedure Laterality Date  . APPLICATION OF CRANIAL NAVIGATION N/A 06/20/2017   Procedure: APPLICATION OF CRANIAL NAVIGATION;  Surgeon: Consuella Lose, MD;  Location: [facility];  Service: Neurosurgery;  Laterality: N/A;  APPLICATION OF CRANIAL NAVIGATION  . CRANIOTOMY Right 06/20/2017   Procedure: STEREOTACTIC RIGHT FRONTAL CRANIOTOMY, RESECTION OF CAVERNOUS MALFORMATION;  Surgeon: Consuella Lose, MD;  Location: [facility];  Service: Neurosurgery;  Laterality: Right;  . WISDOM TOOTH EXTRACTION     Family History: family history includes Hypertension in her father. Social History:  reports that she quit smoking about 4 years ago. Her smoking use included cigarettes. She has a 1.20 pack-year smoking history. She has never used smokeless tobacco. She reports current alcohol use. She reports current drug use.     Maternal Diabetes: No1hr 78 Genetic Screening: Normal Maternal Ultrasounds/Referrals: Normal Fetal Ultrasounds or other Referrals:  None Maternal Substance Abuse:  No Significant Maternal Medications:  None Significant Maternal Lab Results:  Group B Strep negative Other Comments:  None  Review of Systems  Constitutional: Negative for chills and fever.  Respiratory: Negative for shortness of breath.   Cardiovascular: Negative for chest pain, palpitations and leg swelling.  Gastrointestinal: Positive for abdominal pain. Negative for vomiting.  Abdominal distention: w/ contractions.  Neurological: Negative for dizziness, weakness and headaches.  Psychiatric/Behavioral: Negative for suicidal ideas.   History Dilation: 4 Effacement (%): 100 Station: -2 Exam by:: [employer], RN Blood pressure 124/81, pulse 88, temperature 98.4 F (36.9 C), temperature source Oral, resp. rate 18, height 5\' 5"  (1.651 m), weight 71.7 kg, SpO2 97 %. Exam Physical Exam  Prenatal labs: ABO, Rh:  Apos Antibody:  neg Rubella:  imm RPR:   nr HBsAg:   neg HIV:   nr GBS:   NEG  Assessment/Plan: This is a 30yo G1 presenting for r/o labor. Cervical change from fpt to 4cm, category 1 tracing, TOCO q3-88m. GBS neg, admit in labor. May have epidural if desired.  COVID pos on 1/20, isolation, 1 visitor only. Anticipate SVD   Shelly Quinn Shelly Quinn 11/18/2020, 4:46 PM

## 2020-11-19 LAB — CBC
HCT: 32 % — ABNORMAL LOW (ref 36.0–46.0)
Hemoglobin: 10.8 g/dL — ABNORMAL LOW (ref 12.0–15.0)
MCH: 30.6 pg (ref 26.0–34.0)
MCHC: 33.8 g/dL (ref 30.0–36.0)
MCV: 90.7 fL (ref 80.0–100.0)
Platelets: 143 10*3/uL — ABNORMAL LOW (ref 150–400)
RBC: 3.53 MIL/uL — ABNORMAL LOW (ref 3.87–5.11)
RDW: 13.2 % (ref 11.5–15.5)
WBC: 7 10*3/uL (ref 4.0–10.5)
nRBC: 0 % (ref 0.0–0.2)

## 2020-11-19 LAB — RPR: RPR Ser Ql: NONREACTIVE

## 2020-11-19 MED ORDER — LIDOCAINE 1% INJECTION FOR CIRCUMCISION
0.8000 mL | INJECTION | Freq: Once | INTRAVENOUS | Status: DC
  Filled 2020-11-19: qty 1

## 2020-11-19 MED ORDER — SUCROSE 24% NICU/PEDS ORAL SOLUTION: 0.5000 mL | OROMUCOSAL | Status: DC | PRN

## 2020-11-19 MED ORDER — EPINEPHRINE TOPICAL FOR CIRCUMCISION 0.1 MG/ML
1.0000 [drp] | TOPICAL | Status: DC | PRN
  Filled 2020-11-19: qty 1

## 2020-11-19 MED ORDER — ACETAMINOPHEN FOR CIRCUMCISION 160 MG/5 ML
40.0000 mg | Freq: Once | ORAL | Status: DC
  Filled 2020-11-19: qty 1.25

## 2020-11-19 MED ORDER — WHITE PETROLATUM EX OINT: 1.0000 "application " | TOPICAL_OINTMENT | CUTANEOUS | Status: DC | PRN

## 2020-11-19 MED ORDER — ACETAMINOPHEN FOR CIRCUMCISION 160 MG/5 ML
40.0000 mg | ORAL | Status: DC | PRN
  Filled 2020-11-19: qty 1.25

## 2020-11-19 NOTE — Social Work (Signed)
MOB was referred for history of anxiety.   * Referral screened out by Clinical Social Worker because none of the following criteria appear to apply:  ~ History of anxiety/depression during this pregnancy, or of post-partum depression following prior delivery. ~ Diagnosis of anxiety and/or depression within last 3 years. CSW reviewed chart, MOB diagnosis date of 2018 or prior.  OR * MOB's symptoms currently being treated with medication and/or therapy.  Please contact the Clinical Social Worker if needs arise, by [facility] request, or if MOB scores greater than 9/yes to question 10 on Edinburgh Postpartum Depression Screen.  Darra Lis, [place] [employer]  [phone]

## 2020-11-19 NOTE — Anesthesia Postprocedure Evaluation (Signed)
Anesthesia Post Note  Patient: Shelly Quinn  Procedure(s) Performed: AN AD HOC LABOR EPIDURAL     Patient location during evaluation: Mother Baby Anesthesia Type: Epidural Level of consciousness: awake and alert Pain management: pain level controlled Vital Signs Assessment: post-procedure vital signs reviewed and stable Respiratory status: spontaneous breathing, nonlabored ventilation and respiratory function stable Cardiovascular status: stable Postop Assessment: no headache, no backache and epidural receding Anesthetic complications: no   No complications documented.  Last Vitals:  Vitals:   11/19/20 0200 11/19/20 0600  BP: 120/87 117/89  Pulse: 77 79  Resp: 17 18  Temp: 37 C 36.9 C  SpO2: 95%     Last Pain:  Vitals:   11/19/20 0600  TempSrc:   PainSc: 0-No pain   Pain Goal:                   [name]

## 2020-11-19 NOTE — Lactation Note (Signed)
This note was copied from a baby's chart. Lactation Consultation Note  Patient Name: Shelly Quinn IRSWN'I Date: 11/19/2020 Reason for consult: Follow-up assessment Age:29 hours  Follow up with 38 hours old infant of P1 mother. Mother and RN are attempting latch football position, left breast upon [place] arrival. Mother states infant already fed for ~30 minutes. LC offered to assist. Provided support with pillows. Infant is crying and pops on and off breast. Infant able to latch successfully after a couple of attempts. Noted suckling and swallowing. Mother massaged and compressed breast. Observed cheek dimpling and infant transitioning to a shallow latch.  Placed skin to skin while hand expressing to spoon-feed. Collected ~83mL and spoon-fed. Replaced infant skin to skin. LC attempted to use a NS but correct size is not available. Transferred infant to right breast and able to latch successfully. Encouraged parents to hand express and provided colostrum vials to "top up" infant after feeding.  Promoted maternal rest, hydration and food intake. Encouraged to contact [facility] for support when ready to breastfeed baby and recommended to request help for questions or concerns.    Feeding plan:  1. Breastfeed following hunger cues.  2. Offer breast 8 - 12 times in 24h period to establish good milk supply.   3. Pump or hand-express and offer EBM . 4. Encouraged maternal rest, hydration and food intake.  5. Contact Lactation Services or local resources for support, questions or concerns.     All questions answered at this time.    Maternal Data Formula Feeding for Exclusion: No Has patient been taught Hand Expression?: Yes  Feeding Feeding Type: Breast Fed  LATCH Score Latch: Repeated attempts needed to sustain latch, nipple held in mouth throughout feeding, stimulation needed to elicit sucking reflex.  Audible Swallowing: A few with stimulation  Type of Nipple: Everted at rest and after  stimulation (short shaft)  Comfort (Breast/Nipple): Soft / non-tender  Hold (Positioning): Assistance needed to correctly position infant at breast and maintain latch.  LATCH Score: 7  Interventions Interventions: Breast feeding basics reviewed;Assisted with latch;Skin to skin;Breast massage;Hand express;Adjust position;Support pillows;Position options;Expressed milk  Consult Status Consult Status: Follow-up Date: 11/20/20 Follow-up type: In-patient    Mercades Bajaj A Higuera Ancidey 11/19/2020, 8:57 PM

## 2020-11-19 NOTE — Progress Notes (Addendum)
Post Partum Day 1 Subjective: up ad lib, voiding, tolerating PO, + flatus and voids. Reports lochia mild, pain well controlled. She denies fever, body aches, SOB or CP. She does admit to congestion as only possible symptom since covid infection. She is bonding well with baby; breastfeeding. Desires circumcison in hospital if able; office otherwise.   Objective: Blood pressure 117/89, pulse 79, temperature 98.5 F (36.9 C), resp. rate 18, height 5\' 5"  (1.651 m), weight 71.7 kg, SpO2 95 %, unknown if currently breastfeeding.  Physical Exam:  General: alert, cooperative and no distress Lochia: appropriate Uterine Fundus: firm Incision: n/a DVT Evaluation: No evidence of DVT seen on physical exam. No significant calf/ankle edema.  Recent Labs    11/18/20 1644 11/19/20 0740  HGB 13.2 10.8*  HCT 37.2 32.0*    Assessment/Plan: Plan for discharge tomorrow, Breastfeeding and Circumcision prior to discharge if room available per nursing Covid + on 11/12/20 - congestion only at this time   LOS: 1 day   [place] 11/19/2020, 9:38 AM

## 2020-11-20 MED ORDER — ACETAMINOPHEN 325 MG PO TABS: 650.0000 mg | ORAL_TABLET | ORAL | 1 refills | Status: AC | PRN

## 2020-11-20 MED ORDER — IBUPROFEN 600 MG PO TABS: 600.0000 mg | ORAL_TABLET | Freq: Four times a day (QID) | ORAL | 0 refills | Status: AC

## 2020-11-20 NOTE — Discharge Summary (Signed)
Postpartum Discharge Summary      Patient Name: Shelly Quinn DOB: Feb 10, 1991 MRN: [id number]  Date of admission: 11/18/2020 Delivery date:11/18/2020  Delivering provider: Deliah Boston  Date of discharge: 11/20/2020  Admitting diagnosis: [redacted] weeks gestation of pregnancy [Z3A.40] Intrauterine pregnancy: [redacted]w[redacted]d     Secondary diagnosis:  Active Problems:   [redacted] weeks gestation of pregnancy  Additional problems: COVID positive 11/12/20, mild symptoms    Discharge diagnosis: Term Pregnancy Delivered                                              Post partum procedures:none Augmentation: AROM Complications: None  Hospital course: Onset of Labor With Vaginal Delivery      30 y.o. yo G1P1001 at [redacted]w[redacted]d was admitted in Active Labor on 11/18/2020. Patient had an uncomplicated labor course as follows:  Membrane Rupture Time/Date: 5:45 PM ,11/18/2020   Delivery Method:Vaginal, Spontaneous  Episiotomy: None  Lacerations:  1st degree  Patient had an uncomplicated postpartum course.  She is ambulating, tolerating a regular diet, passing flatus, and urinating well. Patient is discharged home in stable condition on 11/20/20.  Newborn Data: Birth date:11/18/2020  Birth time:6:57 PM  Gender:Female  Living status:Living  Apgars:9 ,9  Weight:3671 g   Magnesium Sulfate received: No BMZ received: No Rhophylac:No   Physical exam  Vitals:   11/19/20 1022 11/19/20 1526 11/19/20 2230 11/20/20 0637  BP: 112/74 112/70 113/71 126/79  Pulse: 78 70 70 74  Resp: 17 17 16 18   Temp: 97.8 F (36.6 C) 98.9 F (37.2 C) 98.4 F (36.9 C) (!) 97.5 F (36.4 C)  TempSrc: Oral Oral Oral Oral  SpO2:      Weight:      Height:       General: alert and cooperative Lochia: appropriate Uterine Fundus: firm  Labs: Lab Results  Component Value Date   WBC 7.0 11/19/2020   HGB 10.8 (L) 11/19/2020   HCT 32.0 (L) 11/19/2020   MCV 90.7 11/19/2020   PLT 143 (L) 11/19/2020   CMP Latest Ref Rng & Units  03/19/2018  Glucose 65 - 99 mg/dL 96  BUN 6 - 20 mg/dL 9  Creatinine 0.44 - 1.00 mg/dL 0.82  Sodium 135 - 145 mmol/L 137  Potassium 3.5 - 5.1 mmol/L 4.0  Chloride 101 - 111 mmol/L 104  CO2 22 - 32 mmol/L 25  Calcium 8.9 - 10.3 mg/dL 9.2  Total Protein 6.5 - 8.1 g/dL 7.2  Total Bilirubin 0.3 - 1.2 mg/dL 0.6  Alkaline Phos 38 - 126 U/L 49  AST 15 - 41 U/L 17  ALT 14 - 54 U/L 13(L)   Edinburgh Score: Edinburgh Postnatal Depression Scale Screening Tool 11/18/2020  I have been able to laugh and see the funny side of things. 0  I have looked forward with enjoyment to things. 0  I have blamed myself unnecessarily when things went wrong. 0  I have been anxious or worried for no good reason. 0  I have felt scared or panicky for no good reason. 0  Things have been getting on top of me. 0  I have been so unhappy that I have had difficulty sleeping. 1  I have felt sad or miserable. 0  I have been so unhappy that I have been crying. 0  The thought of harming myself has occurred to me.  0  Edinburgh Postnatal Depression Scale Total 1     After visit meds:  Allergies as of 11/20/2020      Reactions   Dilaudid [hydromorphone Hcl]       Medication List    STOP taking these medications   HYDROcodone-acetaminophen 5-325 MG tablet Commonly known as: NORCO/VICODIN   levETIRAcetam 500 MG tablet Commonly known as: KEPPRA   ondansetron 4 MG disintegrating tablet Commonly known as: Zofran ODT   predniSONE 10 MG (21) Tbpk tablet Commonly known as: STERAPRED UNI-PAK 21 TAB     TAKE these medications   acetaminophen 325 MG tablet Commonly known as: Tylenol Take 2 tablets (650 mg total) by mouth every 4 (four) hours as needed (for pain scale < 4). What changed:   medication strength  how much to take  when to take this  reasons to take this   ibuprofen 600 MG tablet Commonly known as: ADVIL Take 1 tablet (600 mg total) by mouth every 6 (six) hours.        Discharge home in  stable condition Infant Feeding: Breast Infant Disposition:home with mother Discharge instruction: per After Visit Summary and Postpartum booklet. Activity: Advance as tolerated. Pelvic rest for 6 weeks.  Diet: routine diet Future Appointments:No future appointments. Follow up Visit:  Follow-up Information    Shivaji, [name], MD. Schedule an appointment as soon as possible for a visit in 6 week(s).   Specialty: Obstetrics and Gynecology Why: Postpartum Contact information: [address] [place] [id number] [phone]                Please schedule this patient for a In person postpartum visit in 6 weeks with the following provider: MD.  Delivery mode:  Vaginal, Spontaneous  Anticipated Birth Control:  Unsure   11/20/2020 Logan Bores, MD

## 2020-11-20 NOTE — Progress Notes (Signed)
Post Partum Day 2 Subjective: no complaints, up ad lib and tolerating PO  Objective: Blood pressure 126/79, pulse 74, temperature (!) 97.5 F (36.4 C), temperature source Oral, resp. rate 18, height 5\' 5"  (1.651 m), weight 71.7 kg, SpO2 95 %, unknown if currently breastfeeding.  Physical Exam:  General: alert and cooperative Lochia: appropriate Uterine Fundus: firm   Recent Labs    11/18/20 1644 11/19/20 0740  HGB 13.2 10.8*  HCT 37.2 32.0*    Assessment/Plan: Discharge home and Breastfeeding   LOS: 2 days   Shelly Quinn 11/20/2020, 10:34 AM

## 2020-11-20 NOTE — Lactation Note (Signed)
This note was copied from a baby's chart. Lactation Consultation Note  Patient Name: Shelly Quinn Date: 11/20/2020 Reason for consult: Follow-up assessment Age:29 hours  Follow up visit to 43 hours infant with 8.42% weight loss at the time of consult. Parents explain infant has not fed in a couple of hours. Infant had a circumcision and has been very sleepy and uninterested to feed since then. Mother has been pumping and hand expressing.   [place] unswaddled infant and he started stirring, showing hunger cues. Mother would like to try latching to right breast, modified cradle position. After several attempts, infant latched successfully. Mother has easily expressed colostrum.  Mother denies discomfort or pain with latch.  Parents are looking forward to go home. LC reviewed resources available after discharge.  Discharge Plan:   1-Breastfeeding on demand, ensuring a deep, comfortable latch.  2-Undressing infant and place skin to skin when ready to breastfeed. 3-Keep infant awake during breastfeeding session: massaging breast, infant's hand/shoulder/feet. 4-Monitor voids and stools as signs good intake.  5-Promoted maternal hydration, nutrition and rest. 6-Contact LC as needed for feeds/support/concerns/questions.   Maternal Data Has patient been taught Hand Expression?: Yes  Feeding Feeding Type: Breast Fed  LATCH Score Latch: Grasps breast easily, tongue down, lips flanged, rhythmical sucking.  Audible Swallowing: Spontaneous and intermittent  Type of Nipple: Everted at rest and after stimulation (short shaft)  Comfort (Breast/Nipple): Soft / non-tender  Hold (Positioning): Assistance needed to correctly position infant at breast and maintain latch.  LATCH Score: 9  Interventions Interventions: Breast feeding basics reviewed;Assisted with latch;Breast massage;Hand express;Adjust position;Expressed milk;Position options;Support pillows   Consult Status Consult  Status: Complete Date: 11/20/20 Follow-up type: Call as needed    [place] 11/20/2020, 2:15 PM

## 2020-11-22 ENCOUNTER — Inpatient Hospital Stay (HOSPITAL_COMMUNITY)
Admission: AD | Admit: 2020-11-22 | Payer: Medicaid Other | Source: Home / Self Care | Admitting: Obstetrics and Gynecology

## 2020-11-22 ENCOUNTER — Inpatient Hospital Stay (HOSPITAL_COMMUNITY): Payer: Medicaid Other
